# Patient Record
Sex: Male | Born: 1955 | Race: White | Hispanic: No | Marital: Married | Smoking: Former smoker
Health system: Southern US, Community
[De-identification: ages and names within clinical notes are randomized; demographics above are authoritative.]

## PROBLEM LIST (undated history)

## (undated) DIAGNOSIS — M75100 Unspecified rotator cuff tear or rupture of unspecified shoulder, not specified as traumatic: Secondary | ICD-10-CM

## (undated) DIAGNOSIS — M8580 Other specified disorders of bone density and structure, unspecified site: Secondary | ICD-10-CM

## (undated) HISTORY — DX: Unspecified rotator cuff tear or rupture of unspecified shoulder, not specified as traumatic: M75.100

## (undated) HISTORY — PX: REFRACTIVE SURGERY: SHX103

## (undated) HISTORY — PX: VARICOSE VEIN SURGERY: SHX832

## (undated) HISTORY — PX: APPENDECTOMY: SHX54

---

## 2000-04-07 ENCOUNTER — Ambulatory Visit (HOSPITAL_COMMUNITY): Admission: RE | Admit: 2000-04-07 | Discharge: 2000-04-07 | Payer: Self-pay | Admitting: Specialist

## 2000-04-07 ENCOUNTER — Encounter: Payer: Self-pay | Admitting: Specialist

## 2009-12-23 ENCOUNTER — Encounter: Admission: RE | Admit: 2009-12-23 | Discharge: 2010-01-19 | Payer: Self-pay | Admitting: Emergency Medicine

## 2009-12-23 ENCOUNTER — Ambulatory Visit: Payer: Self-pay | Admitting: Emergency Medicine

## 2009-12-23 DIAGNOSIS — M545 Low back pain: Secondary | ICD-10-CM

## 2009-12-24 ENCOUNTER — Encounter: Payer: Self-pay | Admitting: Emergency Medicine

## 2010-01-07 ENCOUNTER — Telehealth (INDEPENDENT_AMBULATORY_CARE_PROVIDER_SITE_OTHER): Payer: Self-pay | Admitting: *Deleted

## 2010-01-20 ENCOUNTER — Encounter: Payer: Self-pay | Admitting: Emergency Medicine

## 2010-04-21 NOTE — Letter (Signed)
Summary: DISCHARGE SUMMARY FOR PHYSCIAL THERAPY SERVICES  DISCHARGE SUMMARY FOR PHYSICAL   Imported By: Dannette Barbara 01/20/2010 11:57:06  _____________________________________________________________________  External Attachment:    Type:   Image     Comment:   External Document

## 2010-04-21 NOTE — Progress Notes (Signed)
  Phone Note Outgoing Call Call back at [facility] Phone [phone]   Call placed by: Emilio Math,  January 07, 2010 2:13 PM Call placed to: Patient Summary of Call: left msg, hope back is feeling better

## 2010-04-21 NOTE — Assessment & Plan Note (Signed)
Summary: BACK PAIN/NH   Vital Signs:  Patient Profile:   55 Years Old Male CC:      Back Pain re-occuring Height:     66.5 inches Weight:      184 pounds O2 Sat:      99 % O2 treatment:    Room Air Temp:     98.8 degrees F oral Pulse rate:   66 / minute Resp:     12 per minute BP sitting:   120 / 77  (left arm) Cuff size:   large  Pt. in pain?   yes    Location:   lower right back    Intensity:   6    Type:       sharp/tingling  Vitals Entered By: Lajean Saver RN (December 23, 2009 10:45 AM)                   Updated Prior Medication List: ALEVE 220 MG TABS (NAPROXEN SODIUM)   Current Allergies: No known allergies History of Present Illness History from: patient Chief Complaint: Back Pain re-occuring History of Present Illness: Reoccurring back pain for months-years.  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  Has only taken Aleve which helps a little bit.  No known trauma.  Always R sided pain w/ spasms.  Last time he was putting on his socks and felt the pain.  Sometimes it causes him to have to go home from work.  No radiation of pain.  Pain is tightness and sharp at times and intermittant.  REVIEW OF SYSTEMS Constitutional Symptoms      Denies fever, chills, night sweats, weight loss, weight gain, and fatigue.  Eyes       Denies change in vision, eye pain, eye discharge, glasses, contact lenses, and eye surgery. Ear/Nose/Throat/Mouth       Denies hearing loss/aids, change in hearing, ear pain, ear discharge, dizziness, frequent runny nose, frequent nose bleeds, sinus problems, sore throat, hoarseness, and tooth pain or bleeding.  Respiratory       Denies dry cough, productive cough, wheezing, shortness of breath, asthma, bronchitis, and emphysema/COPD.  Cardiovascular       Denies murmurs, chest pain, and tires easily with exhertion.    Gastrointestinal       Denies stomach pain, nausea/vomiting, diarrhea, constipation, blood in bowel movements, and  indigestion. Genitourniary       Denies painful urination, kidney stones, and loss of urinary control. Neurological       Complains of numbness and tingling.      Denies paralysis, seizures, and fainting/blackouts. Musculoskeletal       Complains of muscle pain and decreased range of motion.      Denies joint pain, joint stiffness, redness, swelling, muscle weakness, and gout.  Skin       Denies bruising, unusual mles/lumps or sores, and hair/skin or nail changes.  Psych       Denies mood changes, temper/anger issues, anxiety/stress, speech problems, depression, and sleep problems. Other Comments: Patient c/o re-occuring lower right back pain for several months. It occur with activity of particular movements. He c/o tingling at the site of pain. He has not seen his PCP for this. His PCP moved to [place], [place] card was given.    Past History:  Past Medical History: Osteopenia  Past Surgical History: Appendectomy vericose vein removed  Family History: Back pain- siblings  Social History: smokes 1 cigar a month Alcohol use-yes 6-12/week Drug  use-no Occupation: [employer] Drug Use:  no Physical Exam General appearance: well developed, well nourished, no acute distress Heart: regular rate and  rhythm, no murmur.  No bruits heard, no AAA felt. Extremities: normal extremities Neurological: grossly intact and non-focal Back: R lumbar paraspinal spasms palpable, TTP at that spot.  No bony midline tenderness.  SLR neg.  FROM but limited by some stiffness. Skin: no obvious rashes or lesions MSE: oriented to time, place, and person Assessment New Problems: BACK, LOWER, PAIN (ICD-724.2)   Patient Education: Patient and/or caregiver instructed in the following: exercise, weight loss.  Plan New Medications/Changes: FLEXERIL 10 MG TABS (CYCLOBENZAPRINE HCL) 1 tab by mouth at bedtime as needed for spasms  #14 x 0, 12/23/2009, Hoyt Koch MD MELOXICAM 7.5 MG TABS  (MELOXICAM) 1 tab by mouth two times a day for 10 days, then as needed  #40 x 1, 12/23/2009, Hoyt Koch MD  New Orders: New Patient Level II [phone] Planning Comments:   First, PT + Mobic + Flexeril + heat.   If not improving, then Xray and referral to ortho. Follow-up with your primary care physician if not improving or if getting worse.   The patient and/or caregiver has been counseled thoroughly with regard to medications prescribed including dosage, schedule, interactions, rationale for use, and possible side effects and they verbalize understanding.  Diagnoses and expected course of recovery discussed and will return if not improved as expected or if the condition worsens. Patient and/or caregiver verbalized understanding.  Prescriptions: FLEXERIL 10 MG TABS (CYCLOBENZAPRINE HCL) 1 tab by mouth at bedtime as needed for spasms  #14 x 0   Entered and Authorized by:   Hoyt Koch MD   Signed by:   Hoyt Koch MD on 12/23/2009   Method used:   Print then Give to Patient   RxID:   [id number] MELOXICAM 7.5 MG TABS (MELOXICAM) 1 tab by mouth two times a day for 10 days, then as needed  #40 x 1   Entered and Authorized by:   Hoyt Koch MD   Signed by:   Hoyt Koch MD on 12/23/2009   Method used:   Print then Give to Patient   RxID:   [id number]   Orders Added: 1)  New Patient Level II [08676]

## 2010-04-21 NOTE — Letter (Signed)
Summary: EVALUATION FOR PT  EVALUATION FOR PT   Imported By: Dannette Barbara 12/24/2009 13:44:34  _____________________________________________________________________  External Attachment:    Type:   Image     Comment:   External Document

## 2010-11-25 ENCOUNTER — Inpatient Hospital Stay (INDEPENDENT_AMBULATORY_CARE_PROVIDER_SITE_OTHER)
Admission: RE | Admit: 2010-11-25 | Discharge: 2010-11-25 | Disposition: A | Discharge status: Home / Self Care | Payer: 59 | Source: Ambulatory Visit | Attending: Emergency Medicine | Admitting: Emergency Medicine

## 2010-11-25 ENCOUNTER — Encounter: Payer: Self-pay | Admitting: Emergency Medicine

## 2010-11-25 DIAGNOSIS — M545 Low back pain: Secondary | ICD-10-CM

## 2011-02-22 NOTE — Progress Notes (Signed)
Summary: pulled muscle Room 5   Vital Signs:  Patient Profile:   55 Years Old Male CC:      Lower right back pain radiates down to right knee painful with movement Height:     66.5 inches Weight:      178 pounds O2 Sat:      98 % O2 treatment:    Room Air Temp:     98.1 degrees F oral Pulse rate:   54 / minute Pulse rhythm:   regular Resp:     16 per minute BP sitting:   130 / 80  (left arm) Cuff size:   regular  Pt. in pain?   yes    Location:   lower back    Intensity:   9    Type:       sharp  Vitals Entered By: Emilio Math (November 25, 2010 9:09 AM)                   Current Allergies: No known allergies History of Present Illness Chief Complaint: Lower right back pain radiates down to right knee painful with movement History of Present Illness: Lower back pain / buttock pain that radiates to R knee.  Hurts with certain movements.  No trauma, no falls, no heavy lifting.   Trauma: no Bladder/bowel incontinence: no Weakness: no Fever/chills: no Night pain: no Unexplained weight loss: no Cancer/immunosuppression: no PMH of chronic steroid use:  no  Current Meds ALEVE 220 MG TABS (NAPROXEN SODIUM)  FLEXERIL 10 MG TABS (CYCLOBENZAPRINE HCL) 1 by mouth up to three times a day as needed for spasms TRAMADOL HCL 50 MG TABS (TRAMADOL HCL) 1 by mouth q6 hrs as needed for pain PREDNISONE 20 MG TABS (PREDNISONE) 1 by mouth two times a day for 3 days  REVIEW OF SYSTEMS Constitutional Symptoms      Denies fever, chills, night sweats, weight loss, weight gain, and fatigue.  Eyes       Denies change in vision, eye pain, eye discharge, glasses, contact lenses, and eye surgery. Ear/Nose/Throat/Mouth       Denies hearing loss/aids, change in hearing, ear pain, ear discharge, dizziness, frequent runny nose, frequent nose bleeds, sinus problems, sore throat, hoarseness, and tooth pain or bleeding.  Respiratory       Denies dry cough, productive cough, wheezing, shortness  of breath, asthma, bronchitis, and emphysema/COPD.  Cardiovascular       Denies murmurs, chest pain, and tires easily with exhertion.    Gastrointestinal       Denies stomach pain, nausea/vomiting, diarrhea, constipation, blood in bowel movements, and indigestion. Genitourniary       Denies painful urination, kidney stones, and loss of urinary control. Neurological       Denies paralysis, seizures, and fainting/blackouts. Musculoskeletal       Complains of muscle pain, joint pain, joint stiffness, and decreased range of motion.      Denies redness, swelling, muscle weakness, and gout.  Skin       Denies bruising, unusual mles/lumps or sores, and hair/skin or nail changes.  Psych       Denies mood changes, temper/anger issues, anxiety/stress, speech problems, depression, and sleep problems.  Past History:  Past Medical History: Reviewed history from 12/23/2009 and no changes required. Osteopenia  Past Surgical History: Reviewed history from 12/23/2009 and no changes required. Appendectomy vericose vein removed  Family History: Reviewed history from 12/23/2009 and no changes required. Back pain- siblings  Social History:  Reviewed history from 12/23/2009 and no changes required. smokes 1 cigar a month Alcohol use-yes 6-12/week Drug use-no Occupation: [employer] Physical Exam General appearance: well developed, well nourished, no acute distress MSE: oriented to time, place, and person R hip/back: FROM log roll, Flex/ext, abd.  SLR + causes pain in buttock.  No TTP back, SI.  Mild TTP sciatic notch / piriformis.   Resisted flex/ext of hip and knee don't cause any pain.  Distal NV status intact.  Walks with a limp.  Plan New Medications/Changes: PREDNISONE 20 MG TABS (PREDNISONE) 1 by mouth two times a day for 3 days  #6 x 0, 11/25/2010, Hoyt Koch MD TRAMADOL HCL 50 MG TABS (TRAMADOL HCL) 1 by mouth q6 hrs as needed for pain  #24 x 0, 11/25/2010, Hoyt Koch  MD FLEXERIL 10 MG TABS (CYCLOBENZAPRINE HCL) 1 by mouth up to three times a day as needed for spasms  #18 x 0, 11/25/2010, Hoyt Koch MD  New Orders: Est. Patient Level III [phone] Planning Comments:   Likely is sciatic irritation from piriformis (etc).  Will give prednisone, tramadol, and flexeril.  Heating pad, rest, gentle ROM.  No red flags for spinal problems, but would Xray if not improving or if new symptoms.  DDx includes insertional hamstring tendonitis, piriformis syndrome, slipped disk.  If not improving in a few days, will send to ortho/sports med +/- PT.   The patient and/or caregiver has been counseled thoroughly with regard to medications prescribed including dosage, schedule, interactions, rationale for use, and possible side effects and they verbalize understanding.  Diagnoses and expected course of recovery discussed and will return if not improved as expected or if the condition worsens. Patient and/or caregiver verbalized understanding.  Prescriptions: PREDNISONE 20 MG TABS (PREDNISONE) 1 by mouth two times a day for 3 days  #6 x 0   Entered and Authorized by:   Hoyt Koch MD   Signed by:   Hoyt Koch MD on 11/25/2010   Method used:   Print then Give to Patient   RxID:   [id number] TRAMADOL HCL 50 MG TABS (TRAMADOL HCL) 1 by mouth q6 hrs as needed for pain  #24 x 0   Entered and Authorized by:   Hoyt Koch MD   Signed by:   Hoyt Koch MD on 11/25/2010   Method used:   Print then Give to Patient   RxID:   [id number] FLEXERIL 10 MG TABS (CYCLOBENZAPRINE HCL) 1 by mouth up to three times a day as needed for spasms  #18 x 0   Entered and Authorized by:   Hoyt Koch MD   Signed by:   Hoyt Koch MD on 11/25/2010   Method used:   Print then Give to Patient   RxID:   [id number]   Orders Added: 1)  Est. Patient Level III [25366]

## 2011-04-11 ENCOUNTER — Emergency Department
Admission: EM | Admit: 2011-04-11 | Discharge: 2011-04-11 | Disposition: A | Discharge status: Home / Self Care | Payer: 59 | Source: Home / Self Care | Attending: Emergency Medicine | Admitting: Emergency Medicine

## 2011-04-11 ENCOUNTER — Encounter: Payer: Self-pay | Admitting: Emergency Medicine

## 2011-04-11 DIAGNOSIS — S0500XA Injury of conjunctiva and corneal abrasion without foreign body, unspecified eye, initial encounter: Secondary | ICD-10-CM

## 2011-04-11 DIAGNOSIS — S058X9A Other injuries of unspecified eye and orbit, initial encounter: Secondary | ICD-10-CM

## 2011-04-11 HISTORY — DX: Other specified disorders of bone density and structure, unspecified site: M85.80

## 2011-04-11 MED ORDER — EPINEPHRINE 0.3 MG/0.3ML IJ DEVI: 0.3000 mg | INTRAMUSCULAR | Status: DC | PRN

## 2011-04-11 MED ORDER — POLYMYXIN B-TRIMETHOPRIM 10000-0.1 UNIT/ML-% OP SOLN: 1.0000 [drp] | Freq: Four times a day (QID) | OPHTHALMIC | Status: AC

## 2011-04-11 NOTE — ED Provider Notes (Signed)
History     CSN: [id number]  Arrival date & time 04/11/11  1134   First MD Initiated Contact with Patient 04/11/11 1209      Chief Complaint  Patient presents with  . Eye Drainage    (Consider location/radiation/quality/duration/timing/severity/associated sxs/prior treatment) HPI  Nicolas Boone presents today with an EYE COMPLAINT.  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 Since then it has been watering and he is concerned that there is something still there.  Location: right  Onset: 2  Days   Symptoms: Redness: yes Discharge: yes Pain: no Photophobia: no Decreased Vision: no URI symptoms: no Itching/Allergy sxs: no Glaucoma: no Recent eye surgery: no Contact lens use: no  Red Flags Trauma: no Foreign Body: no Vomiting/HA: no Halos around lights: no Chickenpox or zoster: no     Past Medical History  Diagnosis Date  . Osteopenia     Past Surgical History  Procedure Date  . Appendectomy   . Lasix     History reviewed. No pertinent family history.  History  Substance Use Topics  . Smoking status: Current Some Day Smoker  . Smokeless tobacco: Not on file  . Alcohol Use: Yes      Review of Systems  Allergies  Bee  Home Medications   Current Outpatient Rx  Name Route Sig Dispense Refill  . EPINEPHRINE 0.3 MG/0.3ML IJ DEVI Intramuscular Inject 0.3 mLs (0.3 mg total) into the muscle as needed. 1 Device 2  . POLYMYXIN B-TRIMETHOPRIM 10000-0.1 UNIT/ML-% OP SOLN Ophthalmic Apply 1 drop to eye every 6 (six) hours. 10 mL 0    BP 131/81  Pulse 69  Temp(Src) 98.3 F (36.8 C) (Oral)  Resp 18  Ht 5' 6.5" (1.689 m)  Wt 175 lb (79.379 kg)  BMI 27.82 kg/m2  SpO2 99%  Physical Exam  Nursing note and vitals reviewed. Constitutional: He is oriented to person, place, and time. He appears well-developed and well-nourished.  HENT:  Head: Normocephalic and  atraumatic.  Eyes: No scleral icterus.       PERRLA EOMI.  funduscopic examination is normal. Examination of his right eye demonstrates redness on the area between 3:00 and 6:00 with some general conjunctival injection around the whole eye. I inverted the lids and swept them and I do not see any foreign bodies. Fluorescein examination demonstrates some take in the same area of erythema from 3:00 to 6:00. There are no dendrites or ulcers that are seen.  Neck: Neck supple.  Cardiovascular: Regular rhythm and normal heart sounds.   Pulmonary/Chest: Effort normal and breath sounds normal. No respiratory distress.  Neurological: He is alert and oriented to person, place, and time.  Skin: Skin is warm and dry.  Psychiatric: He has a normal mood and affect. His speech is normal.    ED Course  Procedures (including critical care time)  Labs Reviewed - No data to display No results found.   1. Corneal abrasion       MDM   I do not see any foreign bodies. The most likely diagnosis is mild irritation or a healing corneal abrasion. I gave him a prescription for Polytrim eyedrops to use for the next few days for lubrication and to prevent any kind of infection. This should be improving in the next day or 2. If it is not then we will be giving him a call and we  will need to get him to see an eye doctor. This injury did happen at work but he is unsure if he is following with Worker's Comp. He has talked to his job first    Lily Kocher, MD 04/11/11 [phone]

## 2011-04-11 NOTE — ED Notes (Signed)
Right eye drainage x 2 days; wonders if foreign body in it from work?

## 2011-04-13 ENCOUNTER — Telehealth: Payer: Self-pay | Admitting: *Deleted

## 2013-03-08 LAB — HM COLONOSCOPY

## 2013-08-18 ENCOUNTER — Encounter: Payer: Self-pay | Admitting: Emergency Medicine

## 2013-08-18 ENCOUNTER — Emergency Department
Admission: EM | Admit: 2013-08-18 | Discharge: 2013-08-18 | Disposition: A | Discharge status: Home / Self Care | Payer: 59 | Source: Home / Self Care | Attending: Emergency Medicine | Admitting: Emergency Medicine

## 2013-08-18 DIAGNOSIS — H6123 Impacted cerumen, bilateral: Secondary | ICD-10-CM

## 2013-08-18 DIAGNOSIS — H6120 Impacted cerumen, unspecified ear: Secondary | ICD-10-CM

## 2013-08-18 NOTE — ED Provider Notes (Signed)
CSN: [id number]     Arrival date & time 08/18/13  0906 History   None    Chief Complaint  Patient presents with  . Ear Fullness   (Consider location/radiation/quality/duration/timing/severity/associated sxs/prior Treatment) HPI Nicolas Boone is a 58 y.o. male who complains of onset of ear pain for a few days.  The symptoms are constant and mild-moderate in severity. No sore throat No cough No pleuritic pain No wheezing No nasal congestion No post-nasal drainage No sinus pain/pressure No chest congestion No itchy/red eyes + earache (feel plugged up) L>R.  has tried some peroxide but has not helped. No hemoptysis No SOB No chills/sweats No fever No nausea No vomiting No abdominal pain No diarrhea No skin rashes No fatigue No myalgias No headache    Past Medical History  Diagnosis Date  . Osteopenia    Past Surgical History  Procedure Laterality Date  . Appendectomy    . Varicose vein surgery    . Refractive surgery     History reviewed. No pertinent family history. History  Substance Use Topics  . Smoking status: Former Smoker    Types: Cigars  . Smokeless tobacco: Not on file  . Alcohol Use: Yes    Review of Systems  All other systems reviewed and are negative.   Allergies  Bee venom and Nutritional supplements  Home Medications   Prior to Admission medications   Medication Sig Start Date End Date Taking? Authorizing Provider  EPINEPHrine (EPIPEN) 0.3 mg/0.3 mL DEVI Inject 0.3 mLs (0.3 mg total) into the muscle as needed. 04/11/11  Yes Marlaine Hind, MD   BP 125/72  Pulse 59  Temp(Src) 98 F (36.7 C) (Oral)  Ht 5' 6.5" (1.689 m)  Wt 187 lb (84.823 kg)  BMI 29.73 kg/m2  SpO2 97% Physical Exam  Nursing note and vitals reviewed. Constitutional: He is oriented to person, place, and time. He appears well-developed and well-nourished.  HENT:  Head: Normocephalic and atraumatic.  Nose: Nose normal.  Mouth/Throat: No oropharyngeal exudate,  posterior oropharyngeal edema or posterior oropharyngeal erythema.  Bilateral cerumen impaction.  Status post lavage, cerumen has been removed.  No signs of infection.  Eyes: No scleral icterus.  Neck: Neck supple.  Cardiovascular: Regular rhythm and normal heart sounds.   Pulmonary/Chest: Effort normal and breath sounds normal. No respiratory distress.  Neurological: He is alert and oriented to person, place, and time.  Skin: Skin is warm and dry.  Psychiatric: He has a normal mood and affect. His speech is normal.    ED Course  Procedures (including critical care time) Labs Review Labs Reviewed - No data to display  Imaging Review No results found.   MDM   1. Bilateral impacted cerumen     Ear cerumen impaction bilateral.  We flushed out years.  Pain subsided afterwards.  Followup when necessary.  Marlaine Hind, MD 08/18/13 1341

## 2013-08-18 NOTE — ED Notes (Signed)
Nicolas Boone complains of ear pain and fullness for 2 days. Denies fever, chills or sweats.

## 2013-11-13 ENCOUNTER — Encounter: Payer: Self-pay | Admitting: Family Medicine

## 2013-11-13 ENCOUNTER — Ambulatory Visit (INDEPENDENT_AMBULATORY_CARE_PROVIDER_SITE_OTHER): Payer: 59 | Admitting: Family Medicine

## 2013-11-13 ENCOUNTER — Telehealth: Payer: Self-pay | Admitting: Family Medicine

## 2013-11-13 VITALS — BP 109/65 | HR 72 | Ht 66.6 in | Wt 189.0 lb

## 2013-11-13 DIAGNOSIS — R9431 Abnormal electrocardiogram [ECG] [EKG]: Secondary | ICD-10-CM

## 2013-11-13 DIAGNOSIS — M8580 Other specified disorders of bone density and structure, unspecified site: Secondary | ICD-10-CM

## 2013-11-13 DIAGNOSIS — M949 Disorder of cartilage, unspecified: Secondary | ICD-10-CM

## 2013-11-13 DIAGNOSIS — M899 Disorder of bone, unspecified: Secondary | ICD-10-CM

## 2013-11-13 DIAGNOSIS — Z0000 Encounter for general adult medical examination without abnormal findings: Secondary | ICD-10-CM

## 2013-11-13 DIAGNOSIS — Z91030 Bee allergy status: Secondary | ICD-10-CM | POA: Insufficient documentation

## 2013-11-13 DIAGNOSIS — I499 Cardiac arrhythmia, unspecified: Secondary | ICD-10-CM

## 2013-11-13 DIAGNOSIS — R0789 Other chest pain: Secondary | ICD-10-CM

## 2013-11-13 DIAGNOSIS — K449 Diaphragmatic hernia without obstruction or gangrene: Secondary | ICD-10-CM

## 2013-11-13 MED ORDER — EPINEPHRINE 0.3 MG/0.3ML IJ SOAJ: 0.3000 mg | Freq: Once | INTRAMUSCULAR | Status: DC

## 2013-11-13 NOTE — Assessment & Plan Note (Signed)
Filled Rx for Epipen 2 pack

## 2013-11-13 NOTE — Progress Notes (Signed)
Subjective:    Patient ID: Nicolas Boone, male    DOB: 1955-07-15, 58 y.o.   MRN: [id number]  HPI Here for establish care and CPE.    [facility]  Done 01/2013.   Last eye exam was about 3 months ago.  Goes to [facility] in [place].    Reports that his tdap is up-to-date. He says he gets that through work.  Declines a flu shot today.  He says he has had shingles so does not want the shingles vaccine.  He says he has had some chest pain the past which he related to his hiatal hernia. No prior history of heart disease and no family history of heart disease.  Review of Systems  Constitutional: Negative for fever, diaphoresis and unexpected weight change.  HENT: Negative for hearing loss, rhinorrhea, sneezing and tinnitus.   Eyes: Negative for visual disturbance.  Respiratory: Negative for cough and wheezing.   Cardiovascular: Negative for chest pain and palpitations.  Gastrointestinal: Negative for nausea, vomiting, diarrhea and blood in stool.  Genitourinary: Negative for dysuria and discharge.  Musculoskeletal: Negative for arthralgias and myalgias.  Skin: Negative for rash.  Neurological: Negative for headaches.  Hematological: Negative for adenopathy.  Psychiatric/Behavioral: Negative for sleep disturbance and dysphoric mood. The patient is not nervous/anxious.     BP 109/65  Pulse 72  Ht 5' 6.6" (1.692 m)  Wt 189 lb (85.73 kg)  BMI 29.95 kg/m2    Allergies  Allergen Reactions  . Bee Venom   . Nutritional Supplements     Needs epi-pen rx    Past Medical History  Diagnosis Date  . Osteopenia     Past Surgical History  Procedure Laterality Date  . Appendectomy    . Varicose vein surgery    . Refractive surgery      History   Social History  . Marital Status: Single    Spouse Name: Montey Hora    Number of Children: 1  . Years of Education: N/A   Occupational History  . Associate Professor [employer]   Social History  Main Topics  . Smoking status: Former Smoker    Types: Cigars  . Smokeless tobacco: Not on file  . Alcohol Use: Yes  . Drug Use: No  . Sexual Activity: Yes    Partners: Female   Other Topics Concern  . 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  He is married to [place] with 1 child.     Family History  Problem Relation Age of Onset  . Throat cancer Father   . Kidney cancer Brother   . Ovarian cancer Sister   . Diabetes Mellitus II Brother   . Diabetes Mellitus II Sister     Outpatient Encounter Prescriptions as of 11/13/2013  Medication Sig  . clotrimazole (LOTRIMIN AF) 1 % cream Apply 1 application topically 2 (two) times daily.  [name] EPINEPHrine (EPIPEN) 0.3 mg/0.3 mL DEVI Inject 0.3 mLs (0.3 mg total) into the muscle as needed.          Objective:   Physical Exam  Constitutional: He is oriented to person, place, and time. He appears well-developed and well-nourished.  HENT:  Head: Normocephalic and atraumatic.  Right Ear: External ear normal.  Left Ear: External ear normal.  Nose: Nose normal.  Mouth/Throat: Oropharynx is clear and moist.  Eyes: Conjunctivae and EOM are normal. Pupils are equal, round,  and reactive to light.  Neck: Normal range of motion. Neck supple. No thyromegaly present.  Cardiovascular: Normal rate, regular rhythm, normal heart sounds and intact distal pulses.   Pulmonary/Chest: Effort normal and breath sounds normal.  Abdominal: Soft. Bowel sounds are normal. He exhibits no distension and no mass. There is no tenderness. There is no rebound and no guarding.  Musculoskeletal: Normal range of motion.  Lymphadenopathy:    He has no cervical adenopathy.  Neurological: He is alert and oriented to person, place, and time. He has normal reflexes.  Skin: Skin is warm and dry.  Psychiatric: He has a normal mood and affect. His behavior is normal. Judgment and thought content normal.           Assessment & Plan:  Declined flu vaccine today.  CPE Keep up a regular exercise program and make sure you are eating a healthy diet Try to eat 4 servings of dairy a day, or if you are lactose intolerant take a calcium with vitamin D daily.  Your vaccines are up to date.   Irregular heart rhythm-today on exam he was in rhythm but we will do an EKG today. EKG shows rate of 78 beats per minute, sinus rhythm with PVCs. Left axis deviation. Poor R wave progression in the lateral leads. First degree AV block.  Due for screening labs including CMP, lipid panel and PSA. Next graft he was told he had osteopenia about 10 years ago he was encouraged to exercise. We will get a repeat DEXA scan scheduled for him.

## 2013-11-13 NOTE — Telephone Encounter (Signed)
Call patient: He does have a few abnormalities on EKG. Nothing that appears to be acute but I would like to get him in with her cardiologist to review and just make sure that there is no sign of heart disease.

## 2013-11-14 ENCOUNTER — Telehealth: Payer: Self-pay | Admitting: *Deleted

## 2013-11-14 NOTE — Telephone Encounter (Signed)
Spoke with pt's wife about appt. He has appt on 10.12.15 .Loralee Pacas [place]

## 2013-11-14 NOTE — Telephone Encounter (Signed)
Error

## 2013-11-15 NOTE — Addendum Note (Signed)
Addended by: Chalmers Cater on: 11/15/2013 08:00 AM   Modules accepted: Orders

## 2013-11-20 ENCOUNTER — Ambulatory Visit (INDEPENDENT_AMBULATORY_CARE_PROVIDER_SITE_OTHER): Payer: 59

## 2013-11-20 DIAGNOSIS — M810 Age-related osteoporosis without current pathological fracture: Secondary | ICD-10-CM

## 2013-11-20 DIAGNOSIS — M8580 Other specified disorders of bone density and structure, unspecified site: Secondary | ICD-10-CM

## 2013-11-21 ENCOUNTER — Encounter: Payer: Self-pay | Admitting: Family Medicine

## 2013-11-21 DIAGNOSIS — M810 Age-related osteoporosis without current pathological fracture: Secondary | ICD-10-CM | POA: Insufficient documentation

## 2013-11-21 LAB — COMPLETE METABOLIC PANEL WITH GFR
ALT: 16 U/L (ref 0–53)
AST: 19 U/L (ref 0–37)
Albumin: 4.4 g/dL (ref 3.5–5.2)
Alkaline Phosphatase: 63 U/L (ref 39–117)
BUN: 15 mg/dL (ref 6–23)
CO2: 24 mEq/L (ref 19–32)
Calcium: 9.4 mg/dL (ref 8.4–10.5)
Chloride: 104 mEq/L (ref 96–112)
Creat: 0.84 mg/dL (ref 0.50–1.35)
GFR, Est African American: >89 mL/min
GFR, Est Non African American: >89 mL/min
Glucose, Bld: 100 mg/dL — ABNORMAL HIGH (ref 70–99)
Potassium: 4.7 mEq/L (ref 3.5–5.3)
Sodium: 140 mEq/L (ref 135–145)
Total Bilirubin: 0.7 mg/dL (ref 0.2–1.2)
Total Protein: 6.5 g/dL (ref 6.0–8.3)

## 2013-11-21 LAB — PSA: PSA: 2.07 ng/mL (ref <=4.00)

## 2013-11-21 LAB — HEMOGLOBIN A1C
Hgb A1c MFr Bld: 5.9 % — ABNORMAL HIGH (ref <5.7)
Mean Plasma Glucose: 123 mg/dL — ABNORMAL HIGH (ref <117)

## 2013-11-21 LAB — CBC
HCT: 44.8 % (ref 39.0–52.0)
Hemoglobin: 15.5 g/dL (ref 13.0–17.0)
MCH: 30.6 pg (ref 26.0–34.0)
MCHC: 34.6 g/dL (ref 30.0–36.0)
MCV: 88.4 fL (ref 78.0–100.0)
Platelets: 226 10*3/uL (ref 150–400)
RBC: 5.07 MIL/uL (ref 4.22–5.81)
RDW: 14.7 % (ref 11.5–15.5)
WBC: 5.1 10*3/uL (ref 4.0–10.5)

## 2013-11-21 LAB — LIPID PANEL
Cholesterol: 195 mg/dL (ref 0–200)
HDL: 57 mg/dL (ref >39)
LDL Cholesterol: 124 mg/dL — ABNORMAL HIGH (ref 0–99)
Total CHOL/HDL Ratio: 3.4 Ratio
Triglycerides: 69 mg/dL (ref <150)
VLDL: 14 mg/dL (ref 0–40)

## 2013-11-21 LAB — TSH: TSH: 0.975 u[IU]/mL (ref 0.350–4.500)

## 2013-11-28 ENCOUNTER — Other Ambulatory Visit: Payer: Self-pay | Admitting: Family Medicine

## 2013-11-28 MED ORDER — ALENDRONATE SODIUM 70 MG PO TABS: 70.0000 mg | ORAL_TABLET | ORAL | Status: DC

## 2013-11-30 ENCOUNTER — Telehealth: Payer: Self-pay | Admitting: *Deleted

## 2013-11-30 NOTE — Telephone Encounter (Signed)
Called and gave pt's wife the following information:  Call patient: Complete blood count is normal. Thyroid looks great. Metabolic panel including kidney and liver function are normal. Hemoglobin A1c shows that he is in the prediabetic range. Make sure avoiding concentrated sweets, watching his portion sizes on carbohydrates and getting regular exercise. We will recheck his A1c in 6 months. LDL cholesterol is mildly elevated. Again regular exercise and low-fat diet will help improve this.

## 2014-01-09 ENCOUNTER — Institutional Professional Consult (permissible substitution): Payer: 59 | Admitting: Cardiology

## 2014-04-24 ENCOUNTER — Other Ambulatory Visit: Payer: Self-pay | Admitting: *Deleted

## 2014-04-24 ENCOUNTER — Telehealth: Payer: Self-pay | Admitting: Emergency Medicine

## 2014-04-24 DIAGNOSIS — R7309 Other abnormal glucose: Secondary | ICD-10-CM

## 2014-04-24 NOTE — Telephone Encounter (Signed)
Wife of patient wants to know if patient needs to have lipid profile drawn at same time HgbA1c is drawn; informed her that insurance may have limits on spacing this to 6 months and last one drawn 11-20-13.

## 2014-04-25 ENCOUNTER — Encounter: Payer: Self-pay | Admitting: Family Medicine

## 2014-04-25 DIAGNOSIS — R7301 Impaired fasting glucose: Secondary | ICD-10-CM | POA: Insufficient documentation

## 2014-04-25 LAB — HEMOGLOBIN A1C
Hgb A1c MFr Bld: 5.7 % — ABNORMAL HIGH (ref <5.7)
MEAN PLASMA GLUCOSE: 117 mg/dL — AB (ref <117)

## 2014-04-29 ENCOUNTER — Ambulatory Visit (INDEPENDENT_AMBULATORY_CARE_PROVIDER_SITE_OTHER): Payer: 59 | Admitting: Family Medicine

## 2014-04-29 ENCOUNTER — Encounter: Payer: Self-pay | Admitting: Family Medicine

## 2014-04-29 VITALS — BP 121/76 | HR 79 | Ht 65.5 in | Wt 177.0 lb

## 2014-04-29 DIAGNOSIS — H6123 Impacted cerumen, bilateral: Secondary | ICD-10-CM

## 2014-04-29 DIAGNOSIS — M810 Age-related osteoporosis without current pathological fracture: Secondary | ICD-10-CM

## 2014-04-29 DIAGNOSIS — R7301 Impaired fasting glucose: Secondary | ICD-10-CM

## 2014-04-29 NOTE — Progress Notes (Signed)
   Subjective:    Patient ID: Nicolas Boone, male    DOB: 14-Jan-1956, 59 y.o.   MRN: [id number]  HPI Six-month follow-up for impaired fasting glucose-repeat A1c was 5.7 actually come down a little bit. Has lost aobut 12 lbs and is doing great. Leg cramps are better since started eatig more veggies.   Bilateral cerumen impaction-has had to have his ears irrigated previously would like to have this done again today.   Osteoporosis-he's not taking the bisphosphonate anymore he took it for couple months and then decided to stop it. He was not having any major side effects but just decided he was complete changing his lifestyle and is eating so much better and has been exercising regularly. He also stopped the calcium supplement as he is really trying to get the appropriate amount of calcium from his regular diet. Review of Systems     Objective:   Physical Exam  Constitutional: He is oriented to person, place, and time. He appears well-developed and well-nourished.  HENT:  Head: Normocephalic and atraumatic.  bilat cerumen impaction  Cardiovascular: Normal rate, regular rhythm and normal heart sounds.   Pulmonary/Chest: Effort normal and breath sounds normal.  Neurological: He is alert and oriented to person, place, and time.  Skin: Skin is warm and dry.  Psychiatric: He has a normal mood and affect. His behavior is normal.          Assessment & Plan:  IFG - Well controlled. A1C is down today.  He looks fantastic. Has lost 12 pounds. Congratulated him on his success. Follow-up in 6 months.  Osteoporosis-his T score was -2.6. We discussed that he could certainly consider restarting the bisphosphonate in the calcium but ultimately that is up to him based on current guidelines I would recommend he take a bisphosphonate. He really wants to work on diet and exercise first.  Bilateral cerumen impaction-irrigation performed today. Patient tolerated well.

## 2014-11-12 ENCOUNTER — Telehealth: Payer: Self-pay | Admitting: *Deleted

## 2014-11-12 DIAGNOSIS — Z0000 Encounter for general adult medical examination without abnormal findings: Secondary | ICD-10-CM

## 2014-11-12 DIAGNOSIS — R7301 Impaired fasting glucose: Secondary | ICD-10-CM

## 2014-11-12 DIAGNOSIS — Z114 Encounter for screening for human immunodeficiency virus [HIV]: Secondary | ICD-10-CM

## 2014-11-12 DIAGNOSIS — Z1159 Encounter for screening for other viral diseases: Secondary | ICD-10-CM

## 2014-11-12 NOTE — Telephone Encounter (Signed)
Informed pt's wife of orders.Nicolas Boone [place]

## 2014-11-13 ENCOUNTER — Other Ambulatory Visit: Payer: Self-pay

## 2014-11-13 DIAGNOSIS — Z0000 Encounter for general adult medical examination without abnormal findings: Secondary | ICD-10-CM

## 2014-11-13 DIAGNOSIS — Z1159 Encounter for screening for other viral diseases: Secondary | ICD-10-CM

## 2014-11-13 DIAGNOSIS — R7301 Impaired fasting glucose: Secondary | ICD-10-CM

## 2014-11-13 DIAGNOSIS — Z114 Encounter for screening for human immunodeficiency virus [HIV]: Secondary | ICD-10-CM

## 2014-11-14 LAB — COMPLETE METABOLIC PANEL WITH GFR
ALT: 12 U/L (ref 9–46)
AST: 16 U/L (ref 10–35)
Albumin: 4 g/dL (ref 3.6–5.1)
Alkaline Phosphatase: 60 U/L (ref 40–115)
BILIRUBIN TOTAL: 0.5 mg/dL (ref 0.2–1.2)
BUN: 17 mg/dL (ref 7–25)
CO2: 26 mmol/L (ref 20–31)
CREATININE: 0.82 mg/dL (ref 0.70–1.33)
Calcium: 9.2 mg/dL (ref 8.6–10.3)
Chloride: 100 mmol/L (ref 98–110)
GFR, Est African American: >89 mL/min (ref >=60)
GLUCOSE: 95 mg/dL (ref 65–99)
Potassium: 5 mmol/L (ref 3.5–5.3)
SODIUM: 139 mmol/L (ref 135–146)
TOTAL PROTEIN: 6.1 g/dL (ref 6.1–8.1)

## 2014-11-14 LAB — LIPID PANEL
Cholesterol: 171 mg/dL (ref 125–200)
HDL: 56 mg/dL (ref >=40)
LDL CALC: 105 mg/dL (ref <130)
Total CHOL/HDL Ratio: 3.1 Ratio (ref <=5.0)
Triglycerides: 49 mg/dL (ref <150)
VLDL: 10 mg/dL (ref <30)

## 2014-11-14 LAB — HEMOGLOBIN A1C
HEMOGLOBIN A1C: 5.7 % — AB (ref <5.7)
Mean Plasma Glucose: 117 mg/dL — ABNORMAL HIGH (ref <117)

## 2014-11-14 LAB — HEPATITIS C ANTIBODY: HCV Ab: NEGATIVE

## 2014-11-14 LAB — PSA: PSA: 2.32 ng/mL (ref <=4.00)

## 2014-11-14 LAB — HIV ANTIBODY (ROUTINE TESTING W REFLEX): HIV 1&2 Ab, 4th Generation: NONREACTIVE

## 2014-11-15 ENCOUNTER — Encounter: Payer: Self-pay | Admitting: Family Medicine

## 2014-11-15 ENCOUNTER — Ambulatory Visit (INDEPENDENT_AMBULATORY_CARE_PROVIDER_SITE_OTHER): Payer: 59 | Admitting: Family Medicine

## 2014-11-15 ENCOUNTER — Encounter: Payer: 59 | Admitting: Family Medicine

## 2014-11-15 DIAGNOSIS — I8393 Asymptomatic varicose veins of bilateral lower extremities: Secondary | ICD-10-CM

## 2014-11-15 DIAGNOSIS — I8390 Asymptomatic varicose veins of unspecified lower extremity: Secondary | ICD-10-CM | POA: Insufficient documentation

## 2014-11-15 NOTE — Progress Notes (Signed)
   Subjective:    Patient ID: Nicolas Boone, male    DOB: August 25, 1955, 59 y.o.   MRN: [id number]  HPI Patient requests referral to vein and vascular specialist. He has some varicose veins on the right leg treated a couple years ago. He now is having bulging varicose veins on the left leg. He complains that they burn and itch and swell especially with exercise. He has not been wearing compression stockings. Since he had successful treatment in the past with his opposite leg keep would like a new referral.   Review of Systems     Objective:   Physical Exam  Constitutional: He appears well-developed and well-nourished.  Skin:  Varicose veins bulging on his left lower extremity.            Assessment & Plan:  Varicose veins - referral will be placed. Patient will call us back with the preference of location. I think he would be a good candidate for treatment. But did encourage him to wear compression stocking.

## 2015-01-03 ENCOUNTER — Telehealth: Payer: Self-pay | Admitting: *Deleted

## 2015-01-03 DIAGNOSIS — I8390 Asymptomatic varicose veins of unspecified lower extremity: Secondary | ICD-10-CM

## 2015-01-03 NOTE — Telephone Encounter (Signed)
Referral placed.

## 2015-02-15 ENCOUNTER — Encounter: Payer: Self-pay | Admitting: Emergency Medicine

## 2015-02-15 ENCOUNTER — Emergency Department
Admission: EM | Admit: 2015-02-15 | Discharge: 2015-02-15 | Disposition: A | Discharge status: Home / Self Care | Payer: Commercial Managed Care - HMO | Source: Home / Self Care | Attending: Family Medicine | Admitting: Family Medicine

## 2015-02-15 DIAGNOSIS — H6123 Impacted cerumen, bilateral: Secondary | ICD-10-CM

## 2015-02-15 NOTE — ED Provider Notes (Signed)
CSN: [id number]     Arrival date & time 02/15/15  1309 History   First MD Initiated Contact with Patient 02/15/15 1412     Chief Complaint  Patient presents with  . Ear Fullness      HPI Comments: Patient has a history of recurring bilateral cerumen impaction.  He states that his ears have felt clogged for about 3 to 4 months.  Patient is a 59 y.o. male presenting with plugged ear sensation. The history is provided by the patient.  Ear Fullness This is a recurrent problem. Episode onset: 3 to 4 months ago. The problem occurs constantly. The problem has not changed since onset.Associated symptoms comments: none. Nothing aggravates the symptoms. Nothing relieves the symptoms. He has tried nothing for the symptoms.    Past Medical History  Diagnosis Date  . Osteopenia    Past Surgical History  Procedure Laterality Date  . Appendectomy    . Varicose vein surgery    . Refractive surgery     Family History  Problem Relation Age of Onset  . Throat cancer Father   . Kidney cancer Brother   . Ovarian cancer Sister   . Diabetes Mellitus II Brother   . Diabetes Mellitus II Sister    Social History  Substance Use Topics  . Smoking status: Former Smoker    Types: Cigars  . Smokeless tobacco: None  . Alcohol Use: Yes    Review of Systems  All other systems reviewed and are negative.   Allergies  Bee venom and Nutritional supplements  Home Medications   Prior to Admission medications   Medication Sig Start Date End Date Taking? Authorizing Provider  clotrimazole (LOTRIMIN AF) 1 % cream Apply 1 application topically 2 (two) times daily.    Historical Provider, MD  EPINEPHrine (EPIPEN 2-PAK) 0.3 mg/0.3 mL IJ SOAJ injection Inject 0.3 mLs (0.3 mg total) into the muscle once. 11/13/13   Agapito Gamesatherine D Metheney, MD   Meds Ordered and Administered this Visit  Medications - No data to display  BP 102/62 mmHg  Pulse 40  Temp(Src) 98.2 F (36.8 C) (Oral)  Resp 16  Ht 5\' 5"  (1.651  m)  Wt 172 lb (78.019 kg)  BMI 28.62 kg/m2  SpO2 98% No data found.   Physical Exam Nursing notes and Vital Signs reviewed. Appearance:  Patient appears stated age, and in no acute distress Eyes:  Pupils are equal, round, and reactive to light and accomodation.  Extraocular movement is intact.  Conjunctivae are not inflamed  Ears:  Canals are occluded with cerumen bilaterally.  Post lavage, both canals and tympanic membranes normal. Nose:  Normal Lungs:  Clear to auscultation.   Heart:   Irregular rhythm; rate 44  Skin:  No rash present.   ED Course  Procedures  Bilateral ear lavage by nurse     MDM   1. Cerumen impaction, bilateral    Return prn   Lattie HawStephen A Maansi Wike, MD 02/15/15 1459

## 2015-02-15 NOTE — Discharge Instructions (Signed)
Cerumen Impaction The structures of the external ear canal secrete a waxy substance known as cerumen. Excess cerumen can build up in the ear canal, causing a condition known as cerumen impaction. Cerumen impaction can cause ear pain and disrupt the function of the ear. The rate of cerumen production differs for each individual. In certain individuals, the configuration of the ear canal may decrease his or her ability to naturally remove cerumen. CAUSES Cerumen impaction is caused by excessive cerumen production or buildup. RISK FACTORS  Frequent use of swabs to clean ears.  Having narrow ear canals.  Having eczema.  Being dehydrated. SIGNS AND SYMPTOMS  Diminished hearing.  Ear drainage.  Ear pain.  Ear itch. TREATMENT Treatment may involve:  Over-the-counter or prescription ear drops to soften the cerumen.  Removal of cerumen by a health care provider. This may be done with:  Irrigation with warm water. This is the most common method of removal.  Ear curettes and other instruments.  Surgery. This may be done in severe cases. HOME CARE INSTRUCTIONS  Take medicines only as directed by your health care provider.  Do not insert objects into the ear with the intent of cleaning the ear. PREVENTION  Do not insert objects into the ear, even with the intent of cleaning the ear. Removing cerumen as a part of normal hygiene is not necessary, and the use of swabs in the ear canal is not recommended.  Drink enough water to keep your urine clear or pale yellow.  Control your eczema if you have it. SEEK MEDICAL CARE IF:  You develop ear pain.  You develop bleeding from the ear.  The cerumen does not clear after you use ear drops as directed.   This information is not intended to replace advice given to you by your health care provider. Make sure you discuss any questions you have with your health care provider.   Document Released: 04/15/2004 Document Revised: 03/29/2014  Document Reviewed: 10/23/2014 Elsevier Interactive Patient Education 2016 Elsevier Inc.  

## 2015-02-15 NOTE — ED Notes (Signed)
Reports being here to get ears cleaned out; this is necessary about once a year.

## 2015-08-04 ENCOUNTER — Emergency Department
Admission: EM | Admit: 2015-08-04 | Discharge: 2015-08-04 | Disposition: A | Discharge status: Home / Self Care | Payer: Commercial Managed Care - HMO | Source: Home / Self Care | Attending: Family Medicine | Admitting: Family Medicine

## 2015-08-04 ENCOUNTER — Encounter: Payer: Self-pay | Admitting: Emergency Medicine

## 2015-08-04 DIAGNOSIS — H6123 Impacted cerumen, bilateral: Secondary | ICD-10-CM

## 2015-08-04 NOTE — ED Provider Notes (Signed)
CSN: [id number]     Arrival date & time 08/04/15  0941 History   First MD Initiated Contact with Patient 08/04/15 [phone]     Chief Complaint  Patient presents with  . Cerumen Impaction   (Consider location/radiation/quality/duration/timing/severity/associated sxs/prior Treatment) HPI The pt is a 60yo male presenting to [facility] with c/o bilateral ear fullness mild ear pain/discomfort. Pt notes he has been here several times before for same and is requesting his ears be cleaned out. Hx of recurrent cerumen impaction. He has used OTC earwax softener in the past, but none recently. Denies cough, congestion, sore throat, fever, chills, n/v/d.   Past Medical History  Diagnosis Date  . Osteopenia    Past Surgical History  Procedure Laterality Date  . Appendectomy    . Varicose vein surgery    . Refractive surgery     Family History  Problem Relation Age of Onset  . Throat cancer Father   . Kidney cancer Brother   . Ovarian cancer Sister   . Diabetes Mellitus II Brother   . Diabetes Mellitus II Sister    Social History  Substance Use Topics  . Smoking status: Former Smoker    Types: Cigars  . Smokeless tobacco: None  . Alcohol Use: Yes    Review of Systems  Constitutional: Negative for fever and chills.  HENT: Positive for ear pain ( "fullness"/discomfort) and hearing loss ( minimal). Negative for congestion and sore throat.   Respiratory: Negative for cough and wheezing.   Gastrointestinal: Negative for nausea, vomiting and diarrhea.    Allergies  Bee venom and Nutritional supplements  Home Medications   Prior to Admission medications   Medication Sig Start Date End Date Taking? Authorizing Provider  clotrimazole (LOTRIMIN AF) 1 % cream Apply 1 application topically 2 (two) times daily.    Historical Provider, MD  EPINEPHrine (EPIPEN 2-PAK) 0.3 mg/0.3 mL IJ SOAJ injection Inject 0.3 mLs (0.3 mg total) into the muscle once. 11/13/13   Agapito Games, MD   Meds Ordered and  Administered this Visit  Medications - No data to display  BP 124/68 mmHg  Pulse 73  Temp(Src) 98.2 F (36.8 C) (Oral)  Ht  (1.626 m)  Wt 179 lb (81.194 kg)  BMI 30.71 kg/m2  SpO2 97% No data found.   Physical Exam  Constitutional: He is oriented to person, place, and time. He appears well-developed and well-nourished.  HENT:  Head: Normocephalic and atraumatic.  Right Ear: No swelling or tenderness. Tympanic membrane is scarred. Tympanic membrane is not erythematous and not bulging.  Left Ear: No swelling or tenderness. Tympanic membrane is scarred. Tympanic membrane is not erythematous and not bulging.  Nose: Nose normal.  Mouth/Throat: Uvula is midline, oropharynx is clear and moist and mucous membranes are normal.  Bilateral ears: cerumen impaction, cerumen does appear soft. After cerumen removal: mild erythema of ear canals, mild scaring on TMs.   Eyes: EOM are normal.  Neck: Normal range of motion.  Cardiovascular: Normal rate.   Pulmonary/Chest: Effort normal.  Musculoskeletal: Normal range of motion.  Neurological: He is alert and oriented to person, place, and time.  Skin: Skin is warm and dry.  Psychiatric: He has a normal mood and affect. His behavior is normal.  Nursing note and vitals reviewed.   ED Course  Procedures (including critical care time)  Labs Review Labs Reviewed - No data to display  Imaging Review No results found.    MDM   1. Cerumen impaction, bilateral  Pt presenting with cerumen impaction. Cerumen removed using flushing technique. No evidence of underlying infection. F/u with PCP as needed. Patient verbalized understanding and agreement with treatment plan.     Junius Finnerrin O'Malley, PA-C 08/04/15 1018

## 2015-08-04 NOTE — Discharge Instructions (Signed)
You may try over the counter earwax softener such as Debrox, once a week to help keep ear wax soft and hopefully help prevent it from building up.     Cerumen Impaction The structures of the external ear canal secrete a waxy substance known as cerumen. Excess cerumen can build up in the ear canal, causing a condition known as cerumen impaction. Cerumen impaction can cause ear pain and disrupt the function of the ear. The rate of cerumen production differs for each individual. In certain individuals, the configuration of the ear canal may decrease his or her ability to naturally remove cerumen. CAUSES Cerumen impaction is caused by excessive cerumen production or buildup. RISK FACTORS  Frequent use of swabs to clean ears.  Having narrow ear canals.  Having eczema.  Being dehydrated. SIGNS AND SYMPTOMS  Diminished hearing.  Ear drainage.  Ear pain.  Ear itch. TREATMENT Treatment may involve:  Over-the-counter or prescription ear drops to soften the cerumen.  Removal of cerumen by a health care provider. This may be done with:  Irrigation with warm water. This is the most common method of removal.  Ear curettes and other instruments.  Surgery. This may be done in severe cases. HOME CARE INSTRUCTIONS  Take medicines only as directed by your health care provider.  Do not insert objects into the ear with the intent of cleaning the ear. PREVENTION  Do not insert objects into the ear, even with the intent of cleaning the ear. Removing cerumen as a part of normal hygiene is not necessary, and the use of swabs in the ear canal is not recommended.  Drink enough water to keep your urine clear or pale yellow.  Control your eczema if you have it. SEEK MEDICAL CARE IF:  You develop ear pain.  You develop bleeding from the ear.  The cerumen does not clear after you use ear drops as directed.   This information is not intended to replace advice given to you by your health  care provider. Make sure you discuss any questions you have with your health care provider.   Document Released: 04/15/2004 Document Revised: 03/29/2014 Document Reviewed: 10/23/2014 Elsevier Interactive Patient Education 2016 Elsevier Inc.  Ear Drops, Adult You need to put eardrops in your ear. HOME CARE   Put drops in your affected ear as told.  After putting in the drops, lie down with the ear you put the drops in facing up. Stay this way for 10 minutes. Use the ear drops as long as your doctor tells you.  Before you get up, put a cotton ball gently in your ear. Do not push it far in your ear.  Do not wash out your ears unless your doctor says it is okay.  Finish all medicines as told by your doctor. You may be told to keep using the eardrops even if you start to feel better.  See your doctor as told for follow-up visits. GET HELP IF:  You have pain that gets worse.  Any unusual fluid (drainage) is coming from your ear (especially if the fluid stinks).  You have trouble hearing.  You get really dizzy as if the room is spinning and feel sick to your stomach (vertigo).  The outside of your ear becomes red or puffy or both. This may be a sign of an allergic reaction. MAKE SURE YOU:   Understand these instructions.  Will watch your condition.  Will get help right away if you are not doing well or get  worse.   This information is not intended to replace advice given to you by your health care provider. Make sure you discuss any questions you have with your health care provider.   Document Released: 08/26/2009 Document Revised: 03/29/2014 Document Reviewed: 10/03/2012 Elsevier Interactive Patient Education [employer] Elsevier Inc.

## 2015-08-04 NOTE — ED Notes (Signed)
Bi-lateral cerumen  impaction 

## 2015-09-02 ENCOUNTER — Telehealth: Payer: Self-pay | Admitting: Family Medicine

## 2015-09-02 DIAGNOSIS — Z0000 Encounter for general adult medical examination without abnormal findings: Secondary | ICD-10-CM

## 2015-09-02 DIAGNOSIS — R7301 Impaired fasting glucose: Secondary | ICD-10-CM

## 2015-09-02 NOTE — Telephone Encounter (Signed)
Orders placed and faxed please inform pt.Loralee PacasBarkley, Zakari Couchman [place]

## 2015-09-02 NOTE — Telephone Encounter (Signed)
Pt is coming in for physical on July 14th and would like to get blood work done before the apt Thanks.

## 2015-10-01 LAB — HEMOGLOBIN A1C
Hgb A1c MFr Bld: 5.5 % (ref <5.7)
Mean Plasma Glucose: 111 mg/dL

## 2015-10-02 LAB — COMPLETE METABOLIC PANEL WITH GFR
ALT: 15 U/L (ref 9–46)
AST: 19 U/L (ref 10–35)
Albumin: 3.9 g/dL (ref 3.6–5.1)
Alkaline Phosphatase: 60 U/L (ref 40–115)
BUN: 19 mg/dL (ref 7–25)
CO2: 21 mmol/L (ref 20–31)
Calcium: 8.9 mg/dL (ref 8.6–10.3)
Chloride: 105 mmol/L (ref 98–110)
Creat: 0.88 mg/dL (ref 0.70–1.33)
GFR, Est African American: >89 mL/min (ref >=60)
GLUCOSE: 90 mg/dL (ref 65–99)
POTASSIUM: 4.6 mmol/L (ref 3.5–5.3)
SODIUM: 139 mmol/L (ref 135–146)
Total Bilirubin: 0.8 mg/dL (ref 0.2–1.2)
Total Protein: 6 g/dL — ABNORMAL LOW (ref 6.1–8.1)

## 2015-10-02 LAB — LIPID PANEL
CHOL/HDL RATIO: 3.2 ratio (ref <=5.0)
Cholesterol: 188 mg/dL (ref 125–200)
HDL: 59 mg/dL (ref >=40)
LDL CALC: 118 mg/dL (ref <130)
Triglycerides: 53 mg/dL (ref <150)
VLDL: 11 mg/dL (ref <30)

## 2015-10-02 LAB — PSA: PSA: 2.21 ng/mL (ref <=4.00)

## 2015-10-03 ENCOUNTER — Ambulatory Visit (INDEPENDENT_AMBULATORY_CARE_PROVIDER_SITE_OTHER): Payer: Commercial Managed Care - HMO | Admitting: Family Medicine

## 2015-10-03 ENCOUNTER — Ambulatory Visit (INDEPENDENT_AMBULATORY_CARE_PROVIDER_SITE_OTHER): Payer: Commercial Managed Care - HMO

## 2015-10-03 ENCOUNTER — Encounter: Payer: Self-pay | Admitting: Family Medicine

## 2015-10-03 VITALS — BP 122/84 | HR 77 | Wt 182.0 lb

## 2015-10-03 DIAGNOSIS — Z0189 Encounter for other specified special examinations: Secondary | ICD-10-CM | POA: Diagnosis not present

## 2015-10-03 DIAGNOSIS — M79622 Pain in left upper arm: Secondary | ICD-10-CM

## 2015-10-03 DIAGNOSIS — M79609 Pain in unspecified limb: Secondary | ICD-10-CM

## 2015-10-03 DIAGNOSIS — R7301 Impaired fasting glucose: Secondary | ICD-10-CM

## 2015-10-03 DIAGNOSIS — Z0000 Encounter for general adult medical examination without abnormal findings: Secondary | ICD-10-CM

## 2015-10-03 DIAGNOSIS — M25512 Pain in left shoulder: Secondary | ICD-10-CM | POA: Diagnosis not present

## 2015-10-03 NOTE — Patient Instructions (Signed)
Keep up a regular exercise program and make sure you are eating a healthy diet Try to eat 4 servings of dairy a day, or if you are lactose intolerant take a calcium with vitamin D daily.  Your vaccines are up to date.   

## 2015-10-03 NOTE — Progress Notes (Signed)
Subjective:    CC: CPE  HPI: Here for CPE today.   He does have one concern today. He is complaining of some left upper arm pain. It starts just above his elbow and radiates to the base of his shoulder. It doesn't really go into his shoulder per se. He says it's been going on for about 3-4 months. He says some days it doesn't bother him and other days it bothers him more. He does remember injuring his left shoulder last summer. He was lifting something heavy and felt a pop in his shoulder and actually expressing some numbness and tingling afterwards. It seemed to get better on his own so he never sought care. He says the pain is like a deep ache. It actually often wakes him up at night and he needs he will then get up and take a couple of ibuprofen and try to stretch it out. He says the ibuprofen does seem to help. If he is very physically active but does seem to aggravate it. He has not noticed any limitation in range of motion of the elbow or the shoulder. And no neck pain.   He does occasionally have some midsternal chest pain. It was worked up years ago and he was told that it was a hiatal hernia. He has not had any recent problems with that. Next  He does exercise regularly approximately 6 days per week. Anything very healthy low sugar and low carbohydrate diet. Mostly eats vegetables and does get daily protein intake with me. He did have his blood work done and we will review that today.    Fasting glucose-his repeat A1c was down to 5.5 which is fantastic.  BP 122/84 mmHg  Pulse 77  Wt 182 lb (82.555 kg)  SpO2 97%    Allergies  Allergen Reactions  . Bee Venom   . Nutritional Supplements     Needs epi-pen rx    Past Medical History  Diagnosis Date  . Osteopenia     Past Surgical History  Procedure Laterality Date  . Appendectomy    . Varicose vein surgery    . Refractive surgery      Social History   Social History  . Marital Status: Single    Spouse Name: Montey Hora  .  Number of Children: 1  . Years of Education: N/A   Occupational History  . Associate Professor Tobacco   Social History Main Topics  . Smoking status: Former Smoker    Types: Cigars  . Smokeless tobacco: Not on file  . Alcohol Use: Yes  . Drug Use: No  . Sexual Activity:    Partners: Female   Other Topics Concern  . 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  He is married to [place] with 1 child.     Family History  Problem Relation Age of Onset  . Throat cancer Father   . Kidney cancer Brother   . Ovarian cancer Sister   . Diabetes Mellitus II Brother   . Diabetes Mellitus II Sister     Outpatient Encounter Prescriptions as of 10/03/2015  Medication Sig  . clotrimazole (LOTRIMIN AF) 1 % cream Apply 1 application topically 2 (two) times daily.  [name] EPINEPHrine (EPIPEN 2-PAK) 0.3 mg/0.3 mL IJ SOAJ injection Inject 0.3 mLs (0.3 mg total) into the muscle once.   No facility-administered encounter medications on file as of 10/03/2015.        Review  of Systems: No fevers, chills, night sweats, weight loss, chest pain, or shortness of breath.   Objective:    Physical Exam  Constitutional: He is oriented to person, place, and time. He appears well-developed and well-nourished.  HENT:  Head: Normocephalic and atraumatic.  Right Ear: External ear normal.  Left Ear: External ear normal.  Nose: Nose normal.  Mouth/Throat: Oropharynx is clear and moist.  Eyes: Conjunctivae and EOM are normal. Pupils are equal, round, and reactive to light.  Neck: Neck supple. No tracheal deviation present. No thyromegaly present.  Cardiovascular: Normal rate, regular rhythm and normal heart sounds.   Pulmonary/Chest: Effort normal and breath sounds normal.  Abdominal: Soft. Bowel sounds are normal. He exhibits no mass. There is no rebound and no guarding. No hernia.  Musculoskeletal: Normal range of motion.  Cervical spine with normal flexion,  extension, rotation right and left and side bending. Left shoulder with normal range of motion. Negative empty can test bilaterally. Some mild pain with internal rotation reaching behind his back. Strength is 5 out of 5 at the shoulder elbow and wrist. Nontender over the shoulder joint itself and nontender over the wrist joint itself. He's able to cross over without any difficulty.  Lymphadenopathy:    He has no cervical adenopathy.  Neurological: He is alert and oriented to person, place, and time. He has normal reflexes.  Skin: Skin is warm and dry.  Psychiatric: He has a normal mood and affect. His behavior is normal.  Vitals reviewed.     Impression and Recommendations:    CPE Keep up a regular exercise program and make sure you are eating a healthy diet Try to eat 4 servings of dairy a day, or if you are lactose intolerant take a calcium with vitamin D daily.  Your vaccines are up to date.   Impaired fasting glucose-hemoglobin A1c down to 5.5 which is absolutely fantastic. Recommend recheck in one year. Next  Left upper arm pain-we'll get x-rays today to evaluate for any tumor growth or significant arthritis that could be causing his pain is waking him up at night. If otherwise negative then recommend he follow-up with one of our sports medicine doctors for further evaluation.

## 2015-12-18 ENCOUNTER — Ambulatory Visit (INDEPENDENT_AMBULATORY_CARE_PROVIDER_SITE_OTHER): Payer: Commercial Managed Care - HMO | Admitting: Family Medicine

## 2015-12-18 ENCOUNTER — Encounter: Payer: Self-pay | Admitting: Family Medicine

## 2015-12-18 DIAGNOSIS — M25512 Pain in left shoulder: Secondary | ICD-10-CM | POA: Diagnosis not present

## 2015-12-18 DIAGNOSIS — M75100 Unspecified rotator cuff tear or rupture of unspecified shoulder, not specified as traumatic: Secondary | ICD-10-CM | POA: Insufficient documentation

## 2015-12-18 HISTORY — DX: Unspecified rotator cuff tear or rupture of unspecified shoulder, not specified as traumatic: M75.100

## 2015-12-18 NOTE — Patient Instructions (Signed)
Thank you for coming in today. You should hear about MRI scheduling soon.  Return a few days after MRI to go over findings.

## 2015-12-18 NOTE — Progress Notes (Signed)
   Subjective:    I'm seeing this patient as a consultation for:  Dr. Linford ArnoldMetheney  CC: Left arm pain  HPI: 60 year old male presents with left arm pain.  Patient initially injured his arm in April when he was lifting something heavy.  He heard a "pop" and felt numb all the way down his left arm.  Since then the numbness has resolved but his left lateral arm has become painful.  He describes the pain as sharp.  He will occasionally feel the pain in his left shoulder and elbow, but claims it is most prominent in his left lateral arm.  The pain is worse with overhead movements, reaching behind his body, and during the night while sleeping.  Patient claims the pain wakes him up 3-4 times per night.  Thus far, he has tried a trial of rest, home exercise therapy, and NSAIDs without improvement.  His pain has been worsening since April.  He endorses weakness with left hand grip strength.  Denies numbness, tingling, and radicular pain.   No recent injuries.  Past medical history, Surgical history, Family history not pertinant except as noted below, Social history, Allergies, and medications have been entered into the medical record, reviewed, and no changes needed.   Review of Systems: No headache, visual changes, nausea, vomiting, diarrhea, constipation, dizziness, abdominal pain, skin rash, fevers, chills, night sweats, weight loss, swollen lymph nodes, body aches, joint swelling, muscle aches, chest pain, shortness of breath, mood changes, visual or auditory hallucinations.   Objective:    Vitals:   12/18/15 0920  BP: 119/74  Pulse: 76   General: Well Developed, well nourished, and in no acute distress.  Neuro/Psych: Alert and oriented x3, extra-ocular muscles intact, able to move all 4 extremities, sensation grossly intact. Skin: Warm and dry, no rashes noted.  Respiratory: Not using accessory muscles, speaking in full sentences, trachea midline.  Cardiovascular: Pulses palpable, no extremity  edema. Abdomen: Does not appear distended.  MSK: left shoulder:  Normal appearing without swelling or deformity Non-tender to palpation of shoulder and elbow Decreased range of motion secondary to pain with internal rotation. Full range of motion with shoulder flexion, external rotation, and abduction. Increased pain with reaching overhead Equal strength bilaterally Positive Empty Can test Positive Hawkins test,  Negative Spurling Negative Speeds and Yergason   No results found for this or any previous visit (from the past 24 hour(s)). No results found.  Impression and Recommendations:    Assessment and Plan: 60 y.o. male with left lateral arm pain, concerning for rotator cuff pathology.  His pain is worsening despite trials of rest, home exercises, and NSAID therapy.  He does not wish to receive a steroid injection at this time.   - left shoulder MRI - Follow up a few days later to go over results and consider therapy options   Discussed warning signs or symptoms. Please see discharge instructions. Patient expresses understanding.

## 2015-12-29 ENCOUNTER — Other Ambulatory Visit: Payer: Self-pay | Admitting: Family Medicine

## 2015-12-29 ENCOUNTER — Ambulatory Visit (INDEPENDENT_AMBULATORY_CARE_PROVIDER_SITE_OTHER): Payer: Commercial Managed Care - HMO

## 2015-12-29 DIAGNOSIS — M7552 Bursitis of left shoulder: Secondary | ICD-10-CM | POA: Diagnosis not present

## 2015-12-29 DIAGNOSIS — M25512 Pain in left shoulder: Secondary | ICD-10-CM

## 2015-12-29 DIAGNOSIS — Z1389 Encounter for screening for other disorder: Secondary | ICD-10-CM | POA: Diagnosis not present

## 2016-01-05 ENCOUNTER — Ambulatory Visit (INDEPENDENT_AMBULATORY_CARE_PROVIDER_SITE_OTHER): Payer: Commercial Managed Care - HMO | Admitting: Family Medicine

## 2016-01-05 ENCOUNTER — Encounter: Payer: Self-pay | Admitting: Family Medicine

## 2016-01-05 VITALS — BP 106/66 | HR 72 | Wt 176.0 lb

## 2016-01-05 DIAGNOSIS — M75112 Incomplete rotator cuff tear or rupture of left shoulder, not specified as traumatic: Secondary | ICD-10-CM

## 2016-01-05 MED ORDER — NAPROXEN 500 MG PO TABS: 500.0000 mg | ORAL_TABLET | Freq: Every evening | ORAL | 2 refills | Status: DC | PRN

## 2016-01-05 MED ORDER — NITROGLYCERIN 0.2 MG/HR TD PT24: MEDICATED_PATCH | TRANSDERMAL | 1 refills | Status: DC

## 2016-01-05 NOTE — Progress Notes (Signed)
Nicolas OppenheimMichael L Boone is a 60 y.o. male who [facility] today for follow up left shoulder pain.   Patient has had left shoulder pain ongoing for several months. He recently had an MRI that showed partial thickness supraspinatus rotator cuff tear. He continues to experience daily pain specialist bothersome at night and with overhand motion. The pain is interfering with his normal daily functioning. He notes he gets woken up multiple times at night due to the pain. He's used some naproxen which has made a difference.   Past Medical History:  Diagnosis Date  . Osteopenia   . Rotator cuff tear 12/18/2015   Past Surgical History:  Procedure Laterality Date  . APPENDECTOMY    . REFRACTIVE SURGERY    . VARICOSE VEIN SURGERY     Social History  Substance Use Topics  . Smoking status: Former Smoker    Types: Cigars  . Smokeless tobacco: Not on file  . Alcohol use Yes   family history includes Diabetes Mellitus II in his brother and sister; Kidney cancer in his brother; Ovarian cancer in his sister; Throat cancer in his father.  ROS as above:  Medications: Current Outpatient Prescriptions  Medication Sig Dispense Refill  . clotrimazole (LOTRIMIN AF) 1 % cream Apply 1 application topically 2 (two) times daily.    [name]. EPINEPHrine (EPIPEN 2-PAK) 0.3 mg/0.3 mL IJ SOAJ injection Inject 0.3 mLs (0.3 mg total) into the muscle once. 2 Device 1  . naproxen (NAPROSYN) 500 MG tablet Take 1 tablet (500 mg total) by mouth at bedtime as needed for moderate pain. 30 tablet 2  . nitroGLYCERIN (NITRODUR - DOSED IN MG/24 HR) 0.2 mg/hr patch 1/4 patch to left shoulder daily for tendonitis 30 patch 1   No current facility-administered medications for this visit.    Allergies  Allergen Reactions  . Bee Venom   . Nutritional Supplements     Needs epi-pen rx    Health  Maintenance Health Maintenance  Topic Date Due  . INFLUENZA VACCINE  10/21/2015  . TETANUS/TDAP  10/02/2016 (Originally 01/03/1976)  . COLONOSCOPY  03/09/2023  . Hepatitis C Screening  Completed  . HIV Screening  Completed     Exam:  BP 106/66   Pulse 72   Wt 176 lb (79.8 kg)   BMI 30.21 kg/m  Gen: Well NAD Left shoulder: Pain with abduction.  Study Result   CLINICAL DATA:  Left shoulder pain.  Lifting injury June 2016.  EXAM: MRI OF THE LEFT SHOULDER WITHOUT CONTRAST  TECHNIQUE: Multiplanar, multisequence MR imaging of the shoulder was performed. No intravenous contrast was administered.  COMPARISON:  None.  FINDINGS: Rotator cuff: Focal severe tendinosis of the supraspinatus tendon with a high-grade partial-thickness, near full-thickness, bursal surface tear measuring 6 mm in anterior- posterior dimension. Infraspinatus tendon is intact. Teres minor tendon is intact. Subscapularis tendon is intact.  Muscles: No atrophy or fatty replacement of nor abnormal signal within, the muscles of the rotator cuff.  Biceps long head:  Intact.  Acromioclavicular Joint: Normal acromioclavicular joint. Type II acromion. Small amount of subacromial/subdeltoid bursal fluid.  Glenohumeral Joint: No joint effusion. Partial-thickness cartilage loss of the superior glenoid.  Labrum: Grossly intact, but evaluation is limited by lack of intraarticular fluid and patient motion.  Bones: Mild subcortical reactive marrow changes at the supraspinatus insertion. No other focal marrow signal abnormality. No fracture or dislocation.  Other: No fluid collection or hematoma.  IMPRESSION: 1. Focal severe tendinosis of the supraspinatus tendon with a high-grade partial-thickness, near full-thickness, bursal surface tear measuring 6 mm in anterior- posterior dimension. 2. Mild subacromial/subdeltoid bursitis.   Electronically Signed   By: Elige Ko   On: 12/29/2015  10:14        Assessment and Plan: 60 y.o. male with Partial thickness rotator cuff tear with tendinitis of the left supraspinatus. Discussed options. Patient would like to avoid steroids at this time which I agree. Will use nitroglycerin patch protocol along with physical therapy and naproxen. Recheck in one month.   Orders Placed This Encounter  Procedures  . Ambulatory referral to Physical Therapy    Referral Priority:   Routine    Referral Type:   Physical Medicine    Referral Reason:   Specialty Services Required    Requested Specialty:   Physical Therapy    Number of Visits Requested:   1    Discussed warning signs or symptoms. Please see discharge instructions. Patient expresses understanding.

## 2016-01-05 NOTE — Patient Instructions (Signed)
Thank you for coming in today. Attend PT.  Use nitro patches.  Return in 1 month.   Nitroglycerin Protocol   Apply 1/4 nitroglycerin patch to affected area daily.  Change position of patch within the affected area every 24 hours.  You may experience a headache during the first 1-2 weeks of using the patch, these should subside.  If you experience headaches after beginning nitroglycerin patch treatment, you may take your preferred over the counter pain reliever.  Another side effect of the nitroglycerin patch is skin irritation or rash related to patch adhesive.  Please notify our office if you develop more severe headaches or rash, and stop the patch.  Tendon healing with nitroglycerin patch may require 12 to 24 weeks depending on the extent of injury.  Men should not use if taking Viagra, Cialis, or Levitra.   Do not use if you have migraines or rosacea.

## 2016-01-12 ENCOUNTER — Ambulatory Visit: Payer: Commercial Managed Care - HMO | Admitting: Rehabilitative and Restorative Service Providers"

## 2016-01-14 ENCOUNTER — Ambulatory Visit (INDEPENDENT_AMBULATORY_CARE_PROVIDER_SITE_OTHER): Payer: 59 | Admitting: Physical Therapy

## 2016-01-14 ENCOUNTER — Encounter: Payer: Self-pay | Admitting: Physical Therapy

## 2016-01-14 DIAGNOSIS — R531 Weakness: Secondary | ICD-10-CM

## 2016-01-14 DIAGNOSIS — M25512 Pain in left shoulder: Secondary | ICD-10-CM | POA: Diagnosis not present

## 2016-01-14 DIAGNOSIS — R293 Abnormal posture: Secondary | ICD-10-CM | POA: Diagnosis not present

## 2016-01-14 DIAGNOSIS — G8929 Other chronic pain: Secondary | ICD-10-CM

## 2016-01-14 NOTE — Patient Instructions (Addendum)
Trigger Point Dry Needling  . What is Trigger Point Dry Needling (DN)? o DN is a physical therapy technique used to treat muscle pain and dysfunction. Specifically, DN helps deactivate muscle trigger points (muscle knots).  o A thin filiform needle is used to penetrate the skin and stimulate the underlying trigger point. The goal is for a local twitch response (LTR) to occur and for the trigger point to relax. No medication of any kind is injected during the procedure.   . What Does Trigger Point Dry Needling Feel Like?  o The procedure feels different for each individual patient. Some patients report that they do not actually feel the needle enter the skin and overall the process is not painful. Very mild bleeding may occur. However, many patients feel a deep cramping in the muscle in which the needle was inserted. This is the local twitch response.   [name]. How Will I feel after the treatment? o Soreness is normal, and the onset of soreness may not occur for a few hours. Typically this soreness does not last longer than two days.  o Bruising is uncommon, however; ice can be used to decrease any possible bruising.  o In rare cases feeling tired or nauseous after the treatment is normal. In addition, your symptoms may get worse before they get better, this period will typically not last longer than 24 hours.   . What Can I do After My Treatment? o Increase your hydration by drinking more water for the next 24 hours. o You may place ice or heat on the areas treated that have become sore, however, do not use heat on inflamed or bruised areas. Heat often brings more relief post needling. o You can continue your regular activities, but vigorous activity is not recommended initially after the treatment for 24 hours. o DN is best combined with other physical therapy such as strengthening, stretching, and other therapies.     * Use ice on Left shoulder for pain.    Head Press With [facility] Tuck - lie on  back, knees bent, palms facing up, pillow under head.     Tuck chin SLIGHTLY toward chest, keep mouth closed. Feel weight on back of head. Increase weight by pressing head down. Hold _5__ seconds. Relax. Repeat _10__ times. Surface: floor    Shoulder Press - pillow under head    Press both shoulders down. Hold __5_ seconds. Repeat _10__ times.  Surface: floor   Scapular Retraction: Abduction / Extension (Prone)    Lie with arms out from sides 90. Pinch shoulder blades together and raise arms a few inches from floor. Repeat __10__ times per set. Do __3__ sets per session. Do __1__ sessions per day.  Resisted External Rotation: in Neutral - Bilateral - keep chest lifted, shoulders back    Sit or stand, tubing in both hands, elbows at sides, bent to 90, forearms forward. Pinch shoulder blades together and rotate forearms out. Keep elbows at sides. Repeat __10__ times per set. Do __3__ sets per session. Do _1___ sessions per day.  Strengthening: Resisted External Rotation - towel under Left elbow   Hold tubing in right hand, elbow at side and forearm across body. Rotate forearm out. Repeat __10__ times per set. Do __3__ sets per session. Do __1__ sessions per day.  Scapula Adduction With Pectorals, Low   Stand in doorframe with palms against frame and arms at 45. Lean forward and squeeze shoulder blades. Hold _30-45__ seconds. Repeat _1__ times per session. Do _1-2__  sessions per day.  Scapula Adduction With Pectorals, Mid-Range   Stand in doorframe with palms against frame and arms at 90. Lean forward and squeeze shoulder blades. Hold _30-45__ seconds. Repeat _1 times per session. Do _1-2__ sessions per day Copyright  VHI. All rights reserved.

## 2016-01-14 NOTE — Therapy (Signed)
[facility] [address] [place], [place], [address] Phone: [phone]   Fax:  [phone]  Physical Therapy Evaluation  Patient Details  Name: Nicolas OppenheimMichael L Boone MRN: [id number] Date of Birth: 06/12/1955 Referring Provider: Dr Teressa LowerE Corey  Encounter Date: 01/14/2016      PT End of Session - 01/14/16 0800    Visit Number 1   Number of Visits 6   Date for PT Re-Evaluation 02/25/16   PT Start Time 0800   PT Stop Time 0850   PT Time Calculation (min) 50 min      Past Medical History:  Diagnosis Date  . Osteopenia   . Rotator cuff tear 12/18/2015    Past Surgical History:  Procedure Laterality Date  . APPENDECTOMY    . REFRACTIVE SURGERY    . VARICOSE VEIN SURGERY      There were no vitals filed for this visit.       Subjective Assessment - 01/14/16 0800    Subjective Casimiro NeedleMichael reports he was lifting some heavy things while working in his yard this past spring ( about 6 months ), he heard a pop. He thought the pain would settle down and it didn't. He reported it to him MD during his physical, seen by sports MD  had MRI and saw a partial tear. Now the pain is interfering with his sleep.  exercises almost every day, ellipitcal and running.    Diagnostic tests MRI, x-rays - supraspinatus partial tear   Patient Stated Goals sleep more than 2 hrs, don seatbelt and back pack without pain, not have surgery   Currently in Pain? Yes   Pain Score 2    Pain Location Shoulder   Pain Orientation Left   Pain Descriptors / Indicators Aching   Pain Type Chronic pain   Pain Radiating Towards into deltoid   Pain Onset More than a month ago   Pain Frequency Intermittent   Aggravating Factors  sleeping, pressure on Lt side, grad seat belt, ice makes the arm hurt   Pain Relieving Factors place arm in a position where he doesn't have pain.             [facility] - 01/14/16 0001      Assessment   Medical Diagnosis  Partial tear Lt RTC   Referring Provider Dr Teressa LowerE Corey   Onset Date/Surgical Date 07/15/15   Hand Dominance Right   Next MD Visit Nov some time   Prior Therapy none     Precautions   Precautions None     Balance Screen   Has the patient fallen in the past 6 months No   Has the patient had a decrease in activity level because of a fear of falling?  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    Leisure exercise, went back to school  ( almost done)      Observation/Other Assessments   Focus on Therapeutic Outcomes (FOTO)  50% limited     Posture/Postural Control   Posture/Postural Control Postural limitations   Postural Limitations Rounded Shoulders;Forward head;Increased thoracic kyphosis     ROM / Strength   AROM / PROM / Strength AROM;Strength     AROM   AROM  Assessment Site Shoulder;Cervical   Right/Left Shoulder Left  Rt WNL, Lt WNL with pain except reaching behind back   Left Shoulder Internal Rotation 88 Degrees   Left Shoulder External Rotation 85 Degrees   Cervical Flexion WNl   Cervical Extension WNL   Cervical - Right Side Bend WNL   Cervical - Left Side Bend WNL   Cervical - Right Rotation WNL   Cervical - Left Rotation WNL     Strength   Overall Strength Comments mid traps Lt 4+/5, Rt 5/5   Strength Assessment Site Shoulder;Elbow   Right/Left Shoulder Left  Rt WNl   Left Shoulder Flexion 5/5   Left Shoulder ABduction 4-/5  with pain   Left Shoulder Internal Rotation 5/5   Left Shoulder External Rotation 3+/5  with pain   Right/Left Elbow --  bilat WNL     Flexibility   Soft Tissue Assessment /Muscle Length --  very tight pecs bilat     Palpation   Spinal mobility hypomobile in T spine, grade II mobs    Palpation  comment tightness in his Lt shoulder  and anterior chest     Special Tests    Special Tests Rotator Cuff Impingement   Rotator Cuff Impingment tests Leanord Asal test     Hawkins-Kennedy test   Findings Positive   Side Left                   [facility] Adult PT Treatment/Exercise - 01/14/16 0001      Exercises   Exercises Shoulder     Shoulder Exercises: Supine   Other Supine Exercises 10 reps , 5 sec head presses, shoulder presses     Shoulder Exercises: Prone   Other Prone Exercises 3x10 T's      Shoulder Exercises: Standing   External Rotation Strengthening;Left;Theraband  3x10 towel under elbow   Theraband Level (Shoulder External Rotation) Level 2 (Red)   Other Standing Exercises 3x10 bilat scap retraction with ER red band     Shoulder Exercises: Stretch   Other Shoulder Stretches 30 sec doorway stretch low/mid                PT Education - 01/14/16 0841    Education provided Yes   Education Details HEP, shoulder mechanics    Person(s) Educated Patient   Methods Demonstration;Explanation;Handout   Comprehension Returned demonstration;Verbalized understanding             PT Long Term Goals - 01/14/16 0906      PT LONG TERM GOAL #1   Title I with advanced HEP ( 02/25/16)    Time 6   Period Weeks   Status New     PT LONG TERM GOAL #2   Title demo painfree Lt shoulder motion - reaching for seatbelt, donning coat and reaching behind back ( 02/25/16)    Time 6   Period Weeks   Status New     PT LONG TERM GOAL #3   Title sleep per his previous level without Lt shoulder waking him ( 02/25/16)    Time 6   Period Weeks   Status New     PT LONG TERM GOAL #4   Title demo Lt shoulder strength =/> 5-/5 without pain ( 02/25/16)    Time 6   Period Weeks   Status New     PT LONG TERM GOAL #5   Title improve FOTO =/< 31% limited ( 02/25/16)    Time 6   Period  Weeks   Status New               Plan - 01/14/16 0903    Clinical  Impression Statement 60 yo male presents with 6 month h/o Lt shoulder pain.  He has significant postural changes that are very common in people with osteoporosis that place the shoulders in poor alignment and can lead to shoulder issues.  He has pain with AROM and weakness in the Lt shoulder as well as very tight pecs.  Harris wishes to have exercises to perform at home and limit the number of times he comes in for formal sessions.    Rehab Potential Good   PT Frequency 1x / week   PT Duration 6 weeks   PT Treatment/Interventions Ultrasound;Therapeutic exercise;Dry needling;Taping;Vasopneumatic Device;Manual techniques;Cryotherapy;Electrical Stimulation;Patient/family education   PT Next Visit Plan possible TDN to pecs, RTC strengthening, chest openers, gentle thoracic mobs or self mobs   Consulted and Agree with Plan of Care Patient      Patient will benefit from skilled therapeutic intervention in order to improve the following deficits and impairments:  Postural dysfunction, Decreased strength, Pain, Impaired UE functional use, Increased muscle spasms  Visit Diagnosis: Chronic left shoulder pain - Plan: PT plan of care cert/re-cert  Weakness generalized - Plan: PT plan of care cert/re-cert  Abnormal posture - Plan: PT plan of care cert/re-cert     Problem List Patient Active Problem List   Diagnosis Date Noted  . Rotator cuff tear 12/18/2015  . Varicose vein of leg 11/15/2014  . IFG (impaired fasting glucose) 04/25/2014  . Osteoporosis 11/21/2013  . Hiatal hernia 11/13/2013  . 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 BACK, LOWER, PAIN 12/23/2009    Roderic Scarce PT 01/14/2016, 9:10 AM  [facility] [address] [place], [place], [address] Phone: [phone]   Fax:  [phone]  Name: SYED ZUKAS MRN: [id number] Date of Birth: 18-Jun-1955

## 2016-01-23 ENCOUNTER — Ambulatory Visit (INDEPENDENT_AMBULATORY_CARE_PROVIDER_SITE_OTHER): Payer: 59 | Admitting: Physical Therapy

## 2016-01-23 DIAGNOSIS — R531 Weakness: Secondary | ICD-10-CM

## 2016-01-23 DIAGNOSIS — M25512 Pain in left shoulder: Secondary | ICD-10-CM

## 2016-01-23 DIAGNOSIS — G8929 Other chronic pain: Secondary | ICD-10-CM | POA: Diagnosis not present

## 2016-01-23 DIAGNOSIS — R293 Abnormal posture: Secondary | ICD-10-CM

## 2016-01-23 NOTE — Therapy (Signed)
[facility] [address] [place] [address] [place], [place], [address] Phone: [phone]   Fax:  [phone]  Physical Therapy Treatment  Patient Details  Name: Nicolas OppenheimMichael L Maltese MRN: [id number] Date of Birth: Aug 15, 1955 Referring Provider: Dr Teressa LowerE Corey  Encounter Date: 01/23/2016      PT End of Session - 01/23/16 1008    Visit Number 2   Number of Visits 6   Date for PT Re-Evaluation 02/25/16   PT Start Time 0801   PT Stop Time 0848   PT Time Calculation (min) 47 min   Activity Tolerance Patient tolerated treatment well;No increased pain   Behavior During Therapy WFL for tasks assessed/performed      Past Medical History:  Diagnosis Date  . Osteopenia   . Rotator cuff tear 12/18/2015    Past Surgical History:  Procedure Laterality Date  . APPENDECTOMY    . REFRACTIVE SURGERY    . VARICOSE VEIN SURGERY      There were no vitals filed for this visit.      Subjective Assessment - 01/23/16 0804    Subjective Having good and bad days yet, trying to do home exercises. One in the doorframe kills me.    Currently in Pain? Yes   Pain Score 1    Pain Location Shoulder   Pain Orientation Left   Pain Descriptors / Indicators Aching;Numbness   Aggravating Factors  sleeping can't find a comfortable position   Pain Relieving Factors change positions                         [facility] - 01/23/16 0001      Posture/Postural Control   Posture/Postural Control Postural limitations   Postural Limitations Rounded Shoulders;Forward head;Increased thoracic kyphosis   Posture Comments multimodal cues for posture     Lumbar Exercises: Aerobic   UBE (Upper Arm Bike) L1 X5 min (2 fwd, 3 backward)     Shoulder Exercises: Supine   Other Supine Exercises shoulder horizontal abduction chest stretch     Shoulder Exercises: Seated   Row Strengthening;Both;10 reps   Theraband Level (Shoulder Row) Level 1  (Yellow)   Row Limitations cues needed for technique   External Rotation Strengthening;Left;10 reps   Theraband Level (Shoulder External Rotation) Level 1 (Yellow)   External Rotation Limitations cues needed for technique     Shoulder Exercises: Prone   Horizontal ABduction 1 Strengthening   Horizontal ABduction 1 Weight (lbs) 0   Horizontal ABduction 1 Limitations 1X10     Shoulder Exercises: Stretch   Corner Stretch 3 reps;20 seconds   Corner Stretch Limitations to start low and progress up as tolerated, to avoid pain                PT Education - 01/23/16 1007    Education provided Yes   Education Details HEP, relationship between posture/strength and rotator cuff pathology.    Person(s) Educated Patient   Methods Explanation;Demonstration;Tactile cues;Verbal cues;Handout   Comprehension Verbalized understanding;Returned demonstration             PT Long Term Goals - 01/23/16 [id number]      PT LONG TERM GOAL #1   Title I with advanced HEP ( 02/25/16)    Time 6   Period Weeks   Status On-going     PT LONG TERM GOAL #2   Status On-going     PT LONG TERM GOAL #3   Title  sleep per his previous level without Lt shoulder waking him ( 02/25/16)    Time 6   Period Weeks   Status On-going     PT LONG TERM GOAL #4   Title demo Lt shoulder strength =/> 5-/5 without pain ( 02/25/16)    Time 6   Period Weeks   Status On-going     PT LONG TERM GOAL #5   Title improve FOTO =/< 31% limited ( 02/25/16)    Time 6   Period Weeks   Status On-going               Plan - 01/23/16 1028    Clinical Impression Statement Pt presents with continued difficulty with posture and limitations in Lt shoulder strength and ROM. Emphasis was placed on reviewing and modifying the HEP in addition to education on rational for impoved compliance. PT to continue with home and clinic based exercise program with manual therapy and modalities as needed.    Rehab Potential Good   PT  Frequency 1x / week   PT Duration 6 weeks   PT Treatment/Interventions Ultrasound;Therapeutic exercise;Dry needling;Taping;Vasopneumatic Device;Manual techniques;Cryotherapy;Electrical Stimulation;Patient/family education   PT Next Visit Plan Review and progress HEP, consider jt mobs for LT shoulder - stress posture and scapular stabilization.    Consulted and Agree with Plan of Care Patient      Patient will benefit from skilled therapeutic intervention in order to improve the following deficits and impairments:  Postural dysfunction, Decreased strength, Pain, Impaired UE functional use, Increased muscle spasms  Visit Diagnosis: Chronic left shoulder pain  Weakness generalized  Abnormal posture     Problem List Patient Active Problem List   Diagnosis Date Noted  . Rotator cuff tear 12/18/2015  . Varicose vein of leg 11/15/2014  . IFG (impaired fasting glucose) 04/25/2014  . Osteoporosis 11/21/2013  . Hiatal hernia 11/13/2013  . 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 BACK, LOWER, PAIN 12/23/2009    Delton SeeBenjamin Kapena Hamme, PT, CSCS 01/23/2016, 10:45 AM  [facility] [address] [place] [address] [place], [place], [address] Phone: [phone]   Fax:  [phone]  Name: Nicolas OppenheimMichael L Kooi MRN: [id number] Date of Birth: 1956/01/23

## 2016-01-30 ENCOUNTER — Encounter: Payer: Self-pay | Admitting: Physical Therapy

## 2016-01-30 ENCOUNTER — Ambulatory Visit (INDEPENDENT_AMBULATORY_CARE_PROVIDER_SITE_OTHER): Payer: Commercial Managed Care - HMO | Admitting: Physical Therapy

## 2016-01-30 DIAGNOSIS — R531 Weakness: Secondary | ICD-10-CM

## 2016-01-30 DIAGNOSIS — R293 Abnormal posture: Secondary | ICD-10-CM

## 2016-01-30 DIAGNOSIS — M25512 Pain in left shoulder: Secondary | ICD-10-CM

## 2016-01-30 DIAGNOSIS — G8929 Other chronic pain: Secondary | ICD-10-CM | POA: Diagnosis not present

## 2016-01-30 NOTE — Therapy (Signed)
[facility] [address] [place] [address] [place], [place], [address] Phone: [phone]   Fax:  [phone]  Physical Therapy Treatment  Patient Details  Name: Nicolas OppenheimMichael L Boone MRN: [id number] Date of Birth: Mar 10, 1956 Referring Provider: Dr Teressa LowerE Corey  Encounter Date: 01/30/2016      PT End of Session - 01/30/16 1316    Visit Number 3   Date for PT Re-Evaluation 02/25/16   PT Start Time 1022   PT Stop Time 1105   PT Time Calculation (min) 43 min   Activity Tolerance Patient tolerated treatment well   Behavior During Therapy [facility] for tasks assessed/performed      Past Medical History:  Diagnosis Date  . Osteopenia   . Rotator cuff tear 12/18/2015    Past Surgical History:  Procedure Laterality Date  . APPENDECTOMY    . REFRACTIVE SURGERY    . VARICOSE VEIN SURGERY      There were no vitals filed for this visit.      Subjective Assessment - 01/30/16 1025    Subjective Really sore yesterday for some reason but not sure why. Doing much better today thought. Using a pillow under his arm when he is lying down, support seems to help.    Currently in Pain? No/denies                         [facility]/Exercise - 01/30/16 0001      Posture/Postural Control   Posture/Postural Control Postural limitations   Postural Limitations Rounded Shoulders;Forward head;Increased thoracic kyphosis   Posture Comments multimodal cues for posture     Lumbar Exercises: Aerobic   UBE (Upper Arm Bike) L1 X5 min (2.5 fwd, 2.5 backward)     Shoulder Exercises: Supine   Other Supine Exercises isometric IR/ER each 2X10 at 0/30 degrees abduction     Shoulder Exercises: Seated   External Rotation Strengthening;Left;10 reps   Theraband Level (Shoulder External Rotation) Level 1 (Yellow)   External Rotation Limitations 2X10, towel for adduction cue   Internal Rotation Strengthening;Left;10 reps   Theraband Level  (Shoulder Internal Rotation) Level 2 (Red)   Internal Rotation Limitations 2x10, towel for adduction cue     Shoulder Exercises: Prone   Extension Strengthening;Left;10 reps   Horizontal ABduction 1 Strengthening   Horizontal ABduction 1 Weight (lbs) 0   Horizontal ABduction 1 Limitations 1X10   Other Prone Exercises Rows 5# 1X10     Manual Therapy   Manual Therapy Joint mobilization   Manual therapy comments grade 3 destraction/inferior glides Lt GH joint                PT Education - 01/30/16 1316    Education provided Yes   Education Details HEP   Person(s) Educated Patient   Methods Explanation;Demonstration;Tactile cues;Verbal cues;Handout   Comprehension Verbalized understanding;Returned demonstration             PT Long Term Goals - 01/30/16 1044      PT LONG TERM GOAL #1   Title I with advanced HEP ( 02/25/16)    Time 6   Period Weeks   Status On-going     PT LONG TERM GOAL #2   Title demo painfree Lt shoulder motion - reaching for seatbelt, donning coat and reaching behind back ( 02/25/16)    Time 6   Period Weeks   Status On-going     PT LONG TERM GOAL #3   Title  sleep per his previous level without Lt shoulder waking him ( 02/25/16)    Time 6   Period Weeks   Status On-going     PT LONG TERM GOAL #4   Title demo Lt shoulder strength =/> 5-/5 without pain ( 02/25/16)    Time 6   Period Weeks   Status On-going     PT LONG TERM GOAL #5   Title improve FOTO =/< 31% limited ( 02/25/16)    Time 6   Period Weeks   Status On-going               Plan - 01/30/16 1317    Clinical Impression Statement Working on scapular stabilization and rotator cuff strengthening. Trialed manual therapy for Lt glenohumeral joint mobilizations. Able to advance HEP without incrased pain, multimodal cues for technique.    Rehab Potential Good   PT Frequency 1x / week   PT Duration 6 weeks   PT Treatment/Interventions Ultrasound;Therapeutic exercise;Dry  needling;Taping;Vasopneumatic Device;Manual techniques;Cryotherapy;Electrical Stimulation;Patient/family education   PT Next Visit Plan Continue with scapular/RTC strengthening. Manual therapy for [facility] joint mobs as needed.    Consulted and Agree with Plan of Care Patient      Patient will benefit from skilled therapeutic intervention in order to improve the following deficits and impairments:  Postural dysfunction, Decreased strength, Pain, Impaired UE functional use, Increased muscle spasms  Visit Diagnosis: Chronic left shoulder pain  Weakness generalized  Abnormal posture     Problem List Patient Active Problem List   Diagnosis Date Noted  . Rotator cuff tear 12/18/2015  . Varicose vein of leg 11/15/2014  . IFG (impaired fasting glucose) 04/25/2014  . Osteoporosis 11/21/2013  . Hiatal hernia 11/13/2013  . 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 BACK, LOWER, PAIN 12/23/2009    Delton SeeBenjamin Italo Banton, PT, CSCS 01/30/2016, 1:20 PM  [facility] [address] [place] [address] [place], [place], [address] Phone: [phone]   Fax:  [phone]  Name: Nicolas OppenheimMichael L Boone MRN: [id number] Date of Birth: May 09, 1955

## 2016-02-06 ENCOUNTER — Encounter: Payer: Self-pay | Admitting: Physical Therapy

## 2016-02-06 ENCOUNTER — Ambulatory Visit (INDEPENDENT_AMBULATORY_CARE_PROVIDER_SITE_OTHER): Payer: Commercial Managed Care - HMO | Admitting: Physical Therapy

## 2016-02-06 DIAGNOSIS — G8929 Other chronic pain: Secondary | ICD-10-CM

## 2016-02-06 DIAGNOSIS — R293 Abnormal posture: Secondary | ICD-10-CM | POA: Diagnosis not present

## 2016-02-06 DIAGNOSIS — R531 Weakness: Secondary | ICD-10-CM | POA: Diagnosis not present

## 2016-02-06 DIAGNOSIS — M25512 Pain in left shoulder: Secondary | ICD-10-CM

## 2016-02-06 NOTE — Therapy (Addendum)
[place] [address] [place] [address] [place], [place], [address] Phone: [phone]   Fax:  [phone]  Physical Therapy Treatment  Patient Details  Name: Nicolas Boone MRN: [id number] Date of Birth: 1955-07-10 Referring Provider: Dr Steva Colder  Encounter Date: 02/06/2016      PT End of Session - 02/06/16 1042    Visit Number 4   Number of Visits 6   Date for PT Re-Evaluation 02/25/16   PT Start Time 0932   PT Stop Time 1015   PT Time Calculation (min) 43 min   Activity Tolerance Patient tolerated treatment well   Behavior During Therapy [facility] for tasks assessed/performed      Past Medical History:  Diagnosis Date  . Osteopenia   . Rotator cuff tear 12/18/2015    Past Surgical History:  Procedure Laterality Date  . APPENDECTOMY    . REFRACTIVE SURGERY    . VARICOSE VEIN SURGERY      There were no vitals filed for this visit.      Subjective Assessment - 02/06/16 0934    Subjective Was doing great yesterday, didn't have to take any pain meds. Did the elliptical this morning and that got it irritated again but seems to be calming down. Seems to be getting sore when playing the guitar.     Currently in Pain? No/denies   Pain Location Shoulder   Pain Orientation Left   Pain Descriptors / Indicators --  twinging occasionally but pain.                          [place] Adult PT Treatment/Exercise - 02/06/16 0001      Lumbar Exercises: Aerobic   UBE (Upper Arm Bike) L1 X5 min (2.5 fwd, 2.5 backward)     Shoulder Exercises: Supine   Other Supine Exercises isometric IR/ER each 2X10 at 0/30 degrees abduction     Shoulder Exercises: Seated   Row Strengthening;Both;10 reps   Theraband Level (Shoulder Row) Level 2 (Red)   External Rotation Strengthening;Left;10 reps   Theraband Level (Shoulder External Rotation) Level 2 (Red)   Internal Rotation Strengthening;Left;10 reps   Theraband Level (Shoulder  Internal Rotation) Level 3 (Green)     Shoulder Exercises: Prone   Extension Strengthening;Left;10 reps  5 lbs    Horizontal ABduction 1 Strengthening   Horizontal ABduction 1 Weight (lbs) 0   Horizontal ABduction 1 Limitations 1X10   Other Prone Exercises Rows 5# 1X10     Shoulder Exercises: ROM/Strengthening   Other ROM/Strengthening Exercises wand ER at 0 degrees abd and abducted to position of playing guitar.      Manual Therapy   Manual Therapy Joint mobilization   Manual therapy comments grade 3 destraction/inferior /posterior glides Lt GH joint                     PT Long Term Goals - 02/06/16 1049      PT LONG TERM GOAL #1   Title I with advanced HEP ( 02/25/16)    Time 6   Period Weeks   Status On-going     PT LONG TERM GOAL #2   Title demo painfree Lt shoulder motion - reaching for seatbelt, donning coat and reaching behind back ( 02/25/16)    Time 6   Period Weeks   Status On-going     PT LONG TERM GOAL #3   Title sleep per his previous level without Lt  shoulder waking him ( 02/25/16)    Time 6   Period Weeks   Status On-going     PT LONG TERM GOAL #4   Title demo Lt shoulder strength =/> 5-/5 without pain ( 02/25/16)    Time 6   Period Weeks   Status On-going     PT LONG TERM GOAL #5   Title improve FOTO =/< 31% limited ( 02/25/16)    Period Weeks   Status On-going               Plan - 02/06/16 1043    Clinical Impression Statement Pt reports that he has been improving, less pain yesterday and able to do more work without pain. Shoulder more irritated today after using the elliptical. Pt describes having trouble holding and playing his guitar for 20 minutes. Added ER stretch to HEP.    Rehab Potential Good   PT Frequency 1x / week   PT Duration 6 weeks   PT Treatment/Interventions Ultrasound;Therapeutic exercise;Dry needling;Taping;Vasopneumatic Device;Manual techniques;Cryotherapy;Electrical Stimulation;Patient/family education    PT Next Visit Plan Check ER stretches and if any change in playing guitar noted. Continue with scapuar/RTC strengthening.    Consulted and Agree with Plan of Care Patient      Patient will benefit from skilled therapeutic intervention in order to improve the following deficits and impairments:  Postural dysfunction, Decreased strength, Pain, Impaired UE functional use, Increased muscle spasms  Visit Diagnosis: Chronic left shoulder pain  Weakness generalized  Abnormal posture     Problem List Patient Active Problem List   Diagnosis Date Noted  . Rotator cuff tear 12/18/2015  . Varicose vein of leg 11/15/2014  . IFG (impaired fasting glucose) 04/25/2014  . Osteoporosis 11/21/2013  . Hiatal hernia 11/13/2013  . 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 BACK, LOWER, PAIN 12/23/2009    Linard Millers, PT, CSCS 02/06/2016, 11:06 AM  [facility] [place], [place], [address] Phone: [phone]   Fax:  [phone]  Name: Nicolas Boone MRN: [id number] Date of Birth: Nov 05, 1955   PHYSICAL THERAPY DISCHARGE SUMMARY  Visits from Start of Care: 4  Current functional level related to goals / functional outcomes: As noted above. Pt reported making gradual improvement but did not return for further PT sessions as anticipated. Will D/C from PT services.    Remaining deficits: As noted above   Education / Equipment: As noted above Plan: Patient agrees to discharge.  Patient goals were not met. Patient is being discharged due to not returning since the last visit.  ?????    Cassell Clement, PT, CSCS

## 2016-03-26 ENCOUNTER — Ambulatory Visit (INDEPENDENT_AMBULATORY_CARE_PROVIDER_SITE_OTHER): Payer: Commercial Managed Care - HMO | Admitting: Family Medicine

## 2016-03-26 ENCOUNTER — Encounter: Payer: Self-pay | Admitting: Family Medicine

## 2016-03-26 VITALS — BP 118/63 | HR 71 | Wt 178.0 lb

## 2016-03-26 DIAGNOSIS — M75112 Incomplete rotator cuff tear or rupture of left shoulder, not specified as traumatic: Secondary | ICD-10-CM

## 2016-03-26 MED ORDER — TRAMADOL HCL 50 MG PO TABS: 50.0000 mg | ORAL_TABLET | Freq: Three times a day (TID) | ORAL | 0 refills | Status: DC | PRN

## 2016-03-26 NOTE — Patient Instructions (Signed)
Thank you for coming in today. Use tramadol sparingly.  Follow up with Dr Pill.

## 2016-03-26 NOTE — Progress Notes (Signed)
Nicolas Boone is a 61 y.o. male who presents to [facility] today for shoulder pain.   Patient has been seen several times for chronic left shoulder pain. He eventually had an MRI which showed incomplete tear of the supraspinatus tendon along with significant tendinosis. He's very resistant to the idea of a steroid injection as he notes steroids have not helped his problems in the past. He had an extensive course of physical therapy and treatment with nitroglycerin patch protocol. He notes this is helped a little bit however he continues to experience significant pain. Is unable to do normal activities of daily living because of the pain or exercise. He is very frustrated and willing to consider surgery at this time.  Past Medical History:  Diagnosis Date  . Osteopenia   . Rotator cuff tear 12/18/2015   Past Surgical History:  Procedure Laterality Date  . APPENDECTOMY    . REFRACTIVE SURGERY    . VARICOSE VEIN SURGERY     Social History  Substance Use Topics  . Smoking status: Former Smoker    Types: Cigars  . Smokeless tobacco: Not on file  . Alcohol use Yes     ROS:  As above   Medications: Current Outpatient Prescriptions  Medication Sig Dispense Refill  . clotrimazole (LOTRIMIN AF) 1 % cream Apply 1 application topically 2 (two) times daily.    [name] EPINEPHrine (EPIPEN 2-PAK) 0.3 mg/0.3 mL IJ SOAJ injection Inject 0.3 mLs (0.3 mg total) into the muscle once. 2 Device 1  . naproxen (NAPROSYN) 500 MG tablet Take 1 tablet (500 mg total) by mouth at bedtime as needed for moderate pain. 30 tablet 2  . nitroGLYCERIN (NITRODUR - DOSED IN MG/24 HR) 0.2 mg/hr patch 1/4 patch to left shoulder daily for tendonitis 30 patch 1  . traMADol (ULTRAM) 50 MG tablet Take 1 tablet (50 mg total) by mouth every 8 (eight) hours as needed. 15 tablet 0   No current facility-administered medications for this visit.    Allergies  Allergen Reactions  .  Bee Venom   . Nutritional Supplements     Needs epi-pen rx     Exam:  BP 118/63   Pulse 71   Wt 178 lb (80.7 kg)   BMI 30.55 kg/m  General: Well Developed, well nourished, and in no acute distress.  Neuro/Psych: Alert and oriented x3, extra-ocular muscles intact, able to move all 4 extremities, sensation grossly intact. Skin: Warm and dry, no rashes noted.  Respiratory: Not using accessory muscles, speaking in full sentences, trachea midline.  Cardiovascular: Pulses palpable, no extremity edema. Abdomen: Does not appear distended. MSK: Left shoulder:  left shoulder:  Normal appearing without swelling or deformity Non-tender to palpation of shoulder and elbow Decreased range of motion secondary to pain with internal rotation. Full range of motion with shoulder flexion, external rotation, and abduction. Increased pain with reaching overhead Equal strength bilaterally Positive Empty Can test Positive Hawkins test,  Negative Spurling Negative Speeds and Yergason    No results found for this or any previous visit (from the past 48 hour(s)). No results found.    Assessment and Plan: 61 y.o. 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  We discussed options. Patient would like to avoid steroid injection if possible. Recommend discussion with orthopedic surgery. Refer to Dr. Corinna Capra.    Orders Placed This Encounter  Procedures  . Ambulatory referral to Orthopedic Surgery  Referral Priority:   Routine    Referral Type:   Surgical    Referral Reason:   Specialty Services Required    Referred to Provider:   Derrek GuStephen G Pill, MD    Requested Specialty:   Orthopedic Surgery    Number of Visits Requested:   1    Discussed warning signs or symptoms. Please see discharge instructions. Patient expresses understanding.

## 2016-03-29 ENCOUNTER — Telehealth: Payer: Self-pay

## 2016-03-29 NOTE — Telephone Encounter (Signed)
Pt called stating that he would like to be referred to Nicolas Boone per the recommendation of some friends. Please advise.

## 2016-10-26 ENCOUNTER — Other Ambulatory Visit: Payer: Self-pay | Admitting: Family Medicine

## 2016-10-26 DIAGNOSIS — Z0000 Encounter for general adult medical examination without abnormal findings: Secondary | ICD-10-CM

## 2016-10-26 DIAGNOSIS — R7301 Impaired fasting glucose: Secondary | ICD-10-CM

## 2016-10-26 NOTE — Progress Notes (Signed)
Pt's wife requested labs be entered so they can be completed prior to upcoming annual exam. Left VM advising labs have been entered.

## 2016-10-27 LAB — TSH: TSH: 1.06 m[IU]/L (ref 0.40–4.50)

## 2016-10-28 ENCOUNTER — Encounter: Payer: Self-pay | Admitting: Family Medicine

## 2016-10-28 ENCOUNTER — Ambulatory Visit (INDEPENDENT_AMBULATORY_CARE_PROVIDER_SITE_OTHER): Payer: Commercial Managed Care - HMO

## 2016-10-28 ENCOUNTER — Ambulatory Visit (INDEPENDENT_AMBULATORY_CARE_PROVIDER_SITE_OTHER): Payer: Commercial Managed Care - HMO | Admitting: Family Medicine

## 2016-10-28 VITALS — BP 116/69 | HR 72 | Ht 64.25 in | Wt 167.0 lb

## 2016-10-28 DIAGNOSIS — M25511 Pain in right shoulder: Secondary | ICD-10-CM

## 2016-10-28 DIAGNOSIS — H6123 Impacted cerumen, bilateral: Secondary | ICD-10-CM | POA: Diagnosis not present

## 2016-10-28 DIAGNOSIS — Z0000 Encounter for general adult medical examination without abnormal findings: Secondary | ICD-10-CM | POA: Diagnosis not present

## 2016-10-28 DIAGNOSIS — G8929 Other chronic pain: Secondary | ICD-10-CM | POA: Diagnosis not present

## 2016-10-28 DIAGNOSIS — M25611 Stiffness of right shoulder, not elsewhere classified: Secondary | ICD-10-CM | POA: Diagnosis not present

## 2016-10-28 LAB — PSA: PSA: 1.8 ng/mL (ref <=4.0)

## 2016-10-28 LAB — BASIC METABOLIC PANEL WITH GFR
BUN: 15 mg/dL (ref 7–25)
CO2: 21 mmol/L (ref 20–32)
Calcium: 9.4 mg/dL (ref 8.6–10.3)
Chloride: 107 mmol/L (ref 98–110)
Creat: 0.76 mg/dL (ref 0.70–1.33)
GFR, Est African American: >89 mL/min (ref >=60)
GFR, Est Non African American: >89 mL/min (ref >=60)
Glucose, Bld: 95 mg/dL (ref 65–99)
POTASSIUM: 4.5 mmol/L (ref 3.5–5.3)
Sodium: 139 mmol/L (ref 135–146)

## 2016-10-28 LAB — LIPID PANEL
CHOL/HDL RATIO: 3.3 ratio (ref <5.0)
Cholesterol: 179 mg/dL (ref <200)
HDL: 55 mg/dL (ref >40)
LDL CALC: 115 mg/dL — AB (ref <100)
Triglycerides: 47 mg/dL (ref <150)
VLDL: 9 mg/dL (ref <30)

## 2016-10-28 LAB — HEMOGLOBIN A1C
Hgb A1c MFr Bld: 5.4 % (ref <5.7)
Mean Plasma Glucose: 108 mg/dL

## 2016-10-28 NOTE — Progress Notes (Signed)
Subjective:    Patient ID: Nicolas Boone, male    DOB: 1955-06-06, 61 y.o.   MRN: [id number]  HPI 61 year old male comes in today for complete physical exam.  Had colonoscopy performed about 3 years ago so call to get that report. He did go for blood work yesterday sober over those results today. Been running and walking daily for exercise.  He also complains of right shoulder pain. He said about a year ago he was having a lot of difficulty with his left shoulder was eventually diagnosed with a rotator cuff tear. A repair was recommended at the time but he held off and says eventually got enough better that he decided to live with it. But now over the last 6 months his right shoulder has been painful he's not able to locate where exactly the pain is concentrated. Sometimes he says it's more lateral and sometimes it's more anterior. He says it's definitely worse when he lays on it at night and sometimes even small motions like picking up a coffee cup he'll feel like it's week. He says also he plays guitar for fine and after about 20 minutes he has to stop because his shoulder is hurting. He denies any known injury or trauma to the area. He says the pain is very similar to what he was experiencing in his left shoulder about a year ago.  He also wants me to look in his ears today. He does feel like his ear are impacted bilaterally with cerumen. He's had this happen recurrently in the past.  He says he got his last tetanus shot in 2015 at work.  Review of Systems  Comprehensive of review of systems is negative.   BP 116/69   Pulse 72   Ht 5' 4.25" (1.632 m)   Wt 167 lb (75.8 kg)   SpO2 99%   BMI 28.44 kg/m     Allergies  Allergen Reactions  . Bee Venom   . Nutritional Supplements     Needs epi-pen rx    Past Medical History:  Diagnosis Date  . Osteopenia   . Rotator cuff tear 12/18/2015    Past Surgical History:  Procedure Laterality Date  . APPENDECTOMY    . REFRACTIVE  SURGERY    . VARICOSE VEIN SURGERY      Social History   Social History  . Marital status: Single    Spouse name: Montey Hora  . Number of children: 1  . Years of education: N/A   Occupational History  . Associate Professor Tobacco   Social History Main Topics  . Smoking status: Former Smoker    Types: Cigars  . Smokeless tobacco: Never Used  . Alcohol use Yes  . Drug use: No  . Sexual activity: Yes    Partners: Female   Other Topics Concern  . 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  He is married to [place] with 1 child.     Family History  Problem Relation Age of Onset  . Throat cancer Father   . Kidney cancer Brother   . Ovarian cancer Sister   . Diabetes Mellitus II Brother   . Diabetes Mellitus II Sister     Outpatient Encounter Prescriptions as of 10/28/2016  Medication Sig  . naproxen (NAPROSYN) 500 MG tablet Take 1 tablet (500 mg total) by mouth at bedtime as needed for moderate pain.  . traMADol (ULTRAM) 50 MG tablet Take  1 tablet (50 mg total) by mouth every 8 (eight) hours as needed.  . [DISCONTINUED] clotrimazole (LOTRIMIN AF) 1 % cream Apply 1 application topically 2 (two) times daily.  . [DISCONTINUED] EPINEPHrine (EPIPEN 2-PAK) 0.3 mg/0.3 mL IJ SOAJ injection Inject 0.3 mLs (0.3 mg total) into the muscle once.  . [DISCONTINUED] nitroGLYCERIN (NITRODUR - DOSED IN MG/24 HR) 0.2 mg/hr patch 1/4 patch to left shoulder daily for tendonitis   No facility-administered encounter medications on file as of 10/28/2016.          Objective:   Physical Exam  Constitutional: He is oriented to person, place, and time. He appears well-developed and well-nourished.  HENT:  Head: Normocephalic and atraumatic.  Right Ear: External ear normal.  Left Ear: External ear normal.  Nose: Nose normal.  Mouth/Throat: Oropharynx is clear and moist.  Eyes: Pupils are equal, round, and reactive to light. Conjunctivae and EOM are  normal.  Neck: Normal range of motion. Neck supple. No thyromegaly present.  Cardiovascular: Normal rate, regular rhythm, normal heart sounds and intact distal pulses.   Pulmonary/Chest: Effort normal and breath sounds normal.  Abdominal: Soft. Bowel sounds are normal. He exhibits no distension and no mass. There is no tenderness. There is no rebound and no guarding.  Musculoskeletal: Normal range of motion.  Lymphadenopathy:    He has no cervical adenopathy.  Neurological: He is alert and oriented to person, place, and time. He has normal reflexes.  Skin: Skin is warm and dry.  Psychiatric: He has a normal mood and affect. His behavior is normal. Judgment and thought content normal.          Assessment & Plan:  CPE Keep up a regular exercise program and make sure you are eating a healthy diet Try to eat 4 servings of dairy a day, or if you are lactose intolerant take a calcium with vitamin D daily.  Your vaccines are up to date.   Did remind him to get a yearly eye exam. Tetanus updated in the system.  Right shoulder pain-possible shoulder bursitis versus rotator cuff tear they mild. Recommend home physical therapy for the next 3 weeks. Handout provided with stretches and exercises. If not improving them please let me know. Okay to use naproxen. If not improving will refer to sports medicine for further evaluation. We'll go ahead and get x-ray since pain has been present for about 6 months.  Bilateral cerumen impaction - patient had been trying to remove the wax with earwax softening drops at home but not successful. He feels like his ears are clogged. Irrigation recommended and performed. Patient tolerated well.  Indication: Cerumen impaction of the ear(s)  Indication: Cerumen impaction of the ear(s) Medical necessity statement: On physical examination, cerumen impairs clinically significant portions of the external auditory canal, and tympanic membrane. Noted obstructive, copious  cerumen that cannot be removed without irrigation.  Consent: Discussed benefits and risks of procedure and verbal consent obtained Procedure: Patient was prepped for the procedure. Utilized an otoscope to assess and take note of the ear canal, the tympanic membrane, and the presence, amount, and placement of the cerumen. Gentle water irrigation utilized to remove cerumen.  Post procedure examination: shows cerumen was completely removed. Patient tolerated procedure well. The patient is made aware that they may experience temporary vertigo, temporary hearing loss, and temporary discomfort. If these symptom last for more than 24 hours to call the clinic or proceed to the ED.

## 2016-10-28 NOTE — Patient Instructions (Addendum)

## 2017-10-28 ENCOUNTER — Telehealth: Payer: Self-pay

## 2017-10-28 DIAGNOSIS — Z0000 Encounter for general adult medical examination without abnormal findings: Secondary | ICD-10-CM

## 2017-10-28 NOTE — Telephone Encounter (Signed)
Requested labs be done prior to appt on 11-01-17  Labs pended, please review and sign if appropriate  Thanks!!

## 2017-10-31 ENCOUNTER — Other Ambulatory Visit: Payer: Self-pay | Admitting: *Deleted

## 2017-10-31 NOTE — Telephone Encounter (Signed)
Orders signed.Loralee PacasBarkley, Aishani Kalis KirkwoodLynetta

## 2017-11-01 ENCOUNTER — Encounter: Payer: Self-pay | Admitting: Family Medicine

## 2017-11-01 ENCOUNTER — Ambulatory Visit (INDEPENDENT_AMBULATORY_CARE_PROVIDER_SITE_OTHER): Payer: 59 | Admitting: Family Medicine

## 2017-11-01 VITALS — BP 92/52 | HR 86 | Ht 64.0 in | Wt 160.0 lb

## 2017-11-01 DIAGNOSIS — Z0000 Encounter for general adult medical examination without abnormal findings: Secondary | ICD-10-CM

## 2017-11-01 DIAGNOSIS — R252 Cramp and spasm: Secondary | ICD-10-CM

## 2017-11-01 NOTE — Patient Instructions (Signed)

## 2017-11-01 NOTE — Progress Notes (Signed)
Subjective:    Patient ID: Nicolas Boone, male    DOB: 1956-01-25, 62 y.o.   MRN: [id number]  HPI 62 year old male is here today for complete physical exam.  She doing really well overall.  He and his wife just finished building their new home.  Was expensing a lot of leg cramps and was daily around that time but for the last month they actually seem to have gotten better as he is completed the house.   Review of Systems [name] of review of systems is negative except for HPI.  BP (!) 92/52   Pulse 86   Ht 5\' 4"  (1.626 m)   Wt 160 lb (72.6 kg)   SpO2 97%   BMI 27.46 kg/m     Allergies  Allergen Reactions  . Bee Venom   . Nutritional Supplements     Needs epi-pen rx    Past Medical History:  Diagnosis Date  . Osteopenia   . Rotator cuff tear 12/18/2015    Past Surgical History:  Procedure Laterality Date  . APPENDECTOMY    . REFRACTIVE SURGERY    . VARICOSE VEIN SURGERY      Social History   Socioeconomic History  . Marital status: Single    Spouse name: Nicolas Boone  . Number of children: 1  . Years of education: Not on file  . Highest education level: Not on file  Occupational History  . Occupation: Sports administratormechanic    Employer: [employer]  Social Needs  . Financial resource strain: Not on file  . Food insecurity:    Worry: Not on file    Inability: Not on file  . Transportation needs:    Medical: Not on file    Non-medical: Not on file  Tobacco Use  . Smoking status: Former Smoker    Types: Cigars  . Smokeless tobacco: Never Used  Substance and Sexual Activity  . Alcohol use: Yes  . Drug use: No  . Sexual activity: Yes    Partners: Female  Lifestyle  . Physical activity:    Days per week: Not on file    Minutes per session: Not on file  . Stress: Not on file  Relationships  . Social connections:    Talks on phone: Not on file    Gets together: Not on file    Attends religious service: Not on file    Active member of club or organization: Not  on file    Attends meetings of clubs or organizations: Not on file    Relationship status: Not on file  . Intimate partner violence:    Fear of current or ex partner: Not on file    Emotionally abused: Not on file    Physically abused: Not on file    Forced sexual activity: Not on file  Other Topics Concern  . 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  He is married to [place] with 1 child.     Family History  Problem Relation Age of Onset  . Throat cancer Father   . Kidney cancer Brother   . Ovarian cancer Sister   . Diabetes Mellitus II Brother   . Diabetes Mellitus II Sister     Outpatient Encounter Medications as of 11/01/2017  Medication Sig  . [DISCONTINUED] naproxen (NAPROSYN) 500 MG tablet Take 1 tablet (500 mg total) by mouth at bedtime as needed for moderate pain.  . [DISCONTINUED] traMADol (ULTRAM) 50  MG tablet Take 1 tablet (50 mg total) by mouth every 8 (eight) hours as needed.   No facility-administered encounter medications on file as of 11/01/2017.          Objective:   Physical Exam  Constitutional: He is oriented to person, place, and time. He appears well-developed and well-nourished.  HENT:  Head: Normocephalic and atraumatic.  Right Ear: External ear normal.  Left Ear: External ear normal.  Nose: Nose normal.  Mouth/Throat: Oropharynx is clear and moist.  Eyes: Pupils are equal, round, and reactive to light. Conjunctivae and EOM are normal.  Neck: Normal range of motion. Neck supple. No thyromegaly present.  Cardiovascular: Normal rate, regular rhythm, normal heart sounds and intact distal pulses.  Pulmonary/Chest: Effort normal and breath sounds normal.  Abdominal: Soft. Bowel sounds are normal. He exhibits no distension and no mass. There is no tenderness. There is no rebound and no guarding.  Musculoskeletal: Normal range of motion.  Lymphadenopathy:    He has no cervical adenopathy.  Neurological:  He is alert and oriented to person, place, and time. He has normal reflexes.  Skin: Skin is warm and dry.  Psychiatric: He has a normal mood and affect. His behavior is normal. Judgment and thought content normal.        Assessment & Plan:  CPE  Keep up a regular exercise program and make sure you are eating a healthy diet Try to eat 4 servings of dairy a day, or if you are lactose intolerant take a calcium with vitamin D daily.  Your vaccines are up to date.   Cramps-right now they have been better for the last month but if they start recurring then please let me know and we can do some additional work-up.  He can also try some over-the-counter magnesium if he would like.  Potassium level was normal that he just had drawn yesterday.

## 2017-11-02 LAB — COMPLETE METABOLIC PANEL WITH GFR
AG Ratio: 1.9 (calc) (ref 1.0–2.5)
ALBUMIN MSPROF: 4.1 g/dL (ref 3.6–5.1)
ALT: 15 U/L (ref 9–46)
AST: 17 U/L (ref 10–35)
Alkaline phosphatase (APISO): 64 U/L (ref 40–115)
BUN: 17 mg/dL (ref 7–25)
CALCIUM: 9.4 mg/dL (ref 8.6–10.3)
CO2: 27 mmol/L (ref 20–32)
CREATININE: 0.76 mg/dL (ref 0.70–1.25)
Chloride: 105 mmol/L (ref 98–110)
GFR, EST AFRICAN AMERICAN: 115 mL/min/{1.73_m2} (ref >=60)
GFR, Est Non African American: 99 mL/min/{1.73_m2} (ref >=60)
GLUCOSE: 98 mg/dL (ref 65–99)
Globulin: 2.2 g/dL (calc) (ref 1.9–3.7)
Potassium: 4.6 mmol/L (ref 3.5–5.3)
Sodium: 139 mmol/L (ref 135–146)
TOTAL PROTEIN: 6.3 g/dL (ref 6.1–8.1)
Total Bilirubin: 0.7 mg/dL (ref 0.2–1.2)

## 2017-11-02 LAB — LIPID PANEL
CHOL/HDL RATIO: 3.2 (calc) (ref <5.0)
CHOLESTEROL: 181 mg/dL (ref <200)
HDL: 56 mg/dL (ref >40)
LDL CHOLESTEROL (CALC): 110 mg/dL — AB
Non-HDL Cholesterol (Calc): 125 mg/dL (calc) (ref <130)
Triglycerides: 61 mg/dL (ref <150)

## 2017-11-02 LAB — CBC
HEMATOCRIT: 43.5 % (ref 38.5–50.0)
HEMOGLOBIN: 14.9 g/dL (ref 13.2–17.1)
MCH: 31.2 pg (ref 27.0–33.0)
MCHC: 34.3 g/dL (ref 32.0–36.0)
MCV: 91.2 fL (ref 80.0–100.0)
MPV: 11.8 fL (ref 7.5–12.5)
Platelets: 206 10*3/uL (ref 140–400)
RBC: 4.77 10*6/uL (ref 4.20–5.80)
RDW: 13.1 % (ref 11.0–15.0)
WBC: 4.2 10*3/uL (ref 3.8–10.8)

## 2017-11-02 LAB — HEMOGLOBIN A1C W/OUT EAG: HEMOGLOBIN A1C: 5.5 %{Hb} (ref <5.7)

## 2017-11-02 LAB — PSA: PSA: 1.9 ng/mL (ref <=4.0)

## 2017-11-02 LAB — TSH: TSH: 1.16 mIU/L (ref 0.40–4.50)

## 2018-03-28 IMAGING — MR MR SHOULDER*L* W/O CM
5 series · 40 of 40 positions shown · non-contrast
Comparison: None.

CLINICAL DATA: Left shoulder pain.  Lifting injury August 2014.

EXAM:
MRI OF THE LEFT SHOULDER WITHOUT CONTRAST
TECHNIQUE: Multiplanar, multisequence MR imaging of the shoulder was performed.
No intravenous contrast was administered.

[Series 3: T2 fat-sat · axial · 4.0mm · 0.55mm/px · z∈[-24,+60]mm · 8 of 20 slices shown (1 of 3)]
[im 1/20]
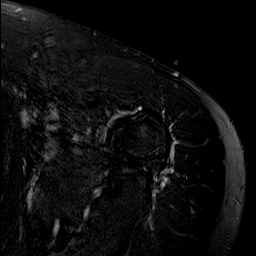
[im 3/20]
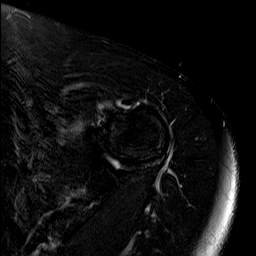
[im 6/20]
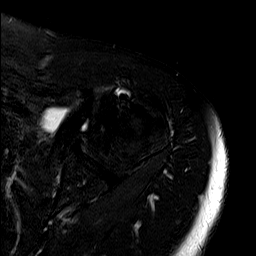
[im 9/20]
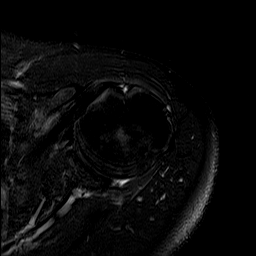
[im 11/20]
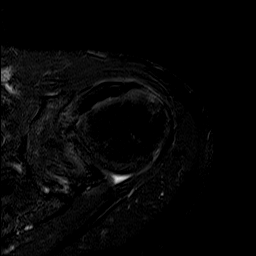
[im 14/20]
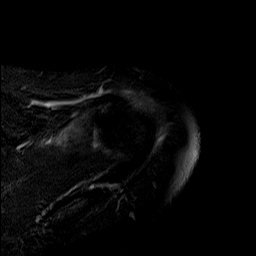
[im 17/20]
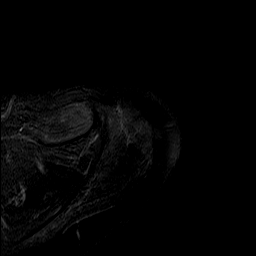
[im 20/20]
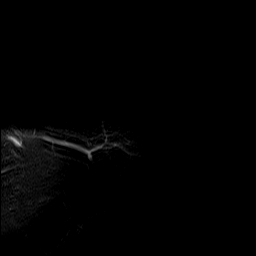

[Series 4: T2 fat-sat · oblique · 4.0mm · 0.55mm/px · 8 of 18 slices shown (2 of 3)]
[im 1/18]
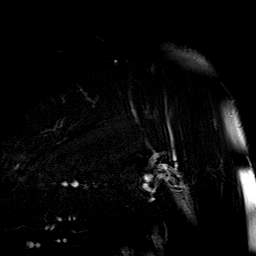
[im 3/18]
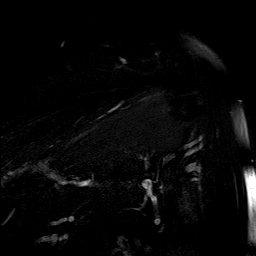
[im 5/18]
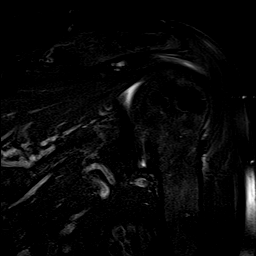
[im 8/18]
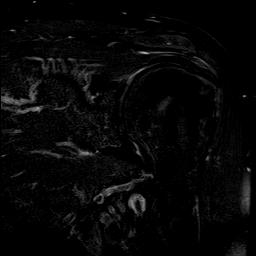
[im 10/18]
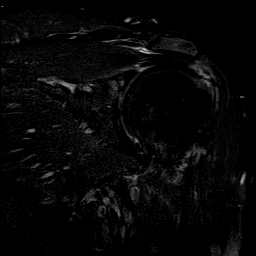
[im 13/18]
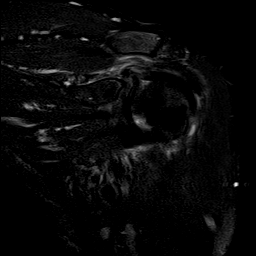
[im 15/18]
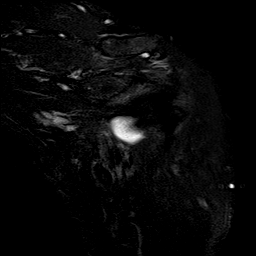
[im 18/18]
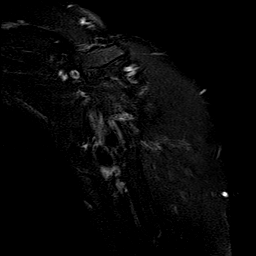

[Series 5: PD · oblique · 4.0mm · 0.55mm/px · 8 of 18 slices shown]
[im 1/18]
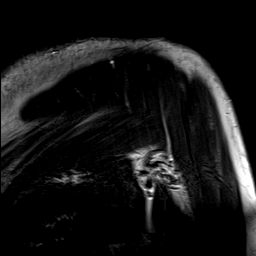
[im 3/18]
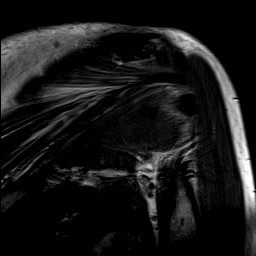
[im 5/18]
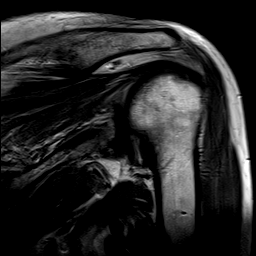
[im 8/18]
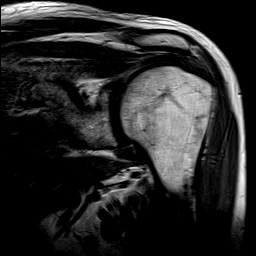
[im 10/18]
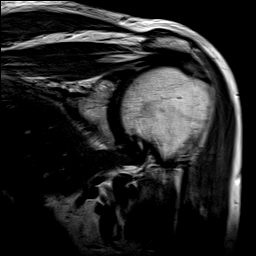
[im 13/18]
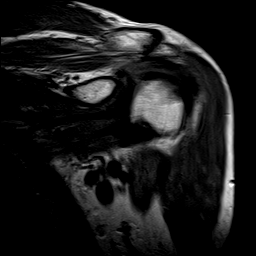
[im 15/18]
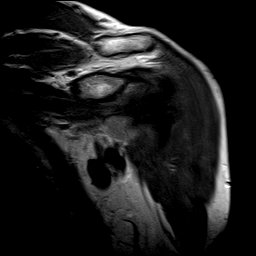
[im 18/18]
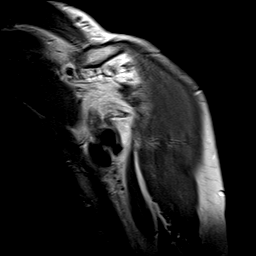

[Series 6: T1 · oblique · 4.0mm · 0.55mm/px · 8 of 20 slices shown]
[im 1/20]
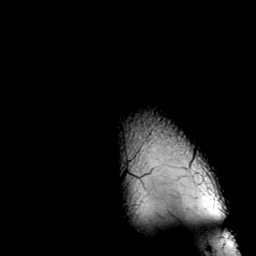
[im 3/20]
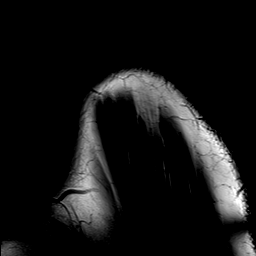
[im 6/20]
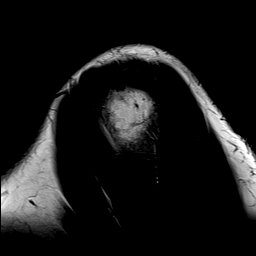
[im 9/20]
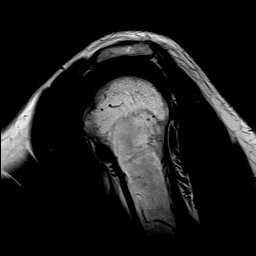
[im 11/20]
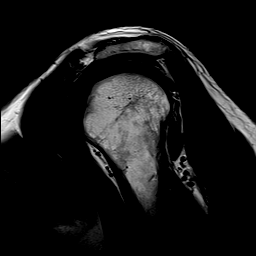
[im 14/20]
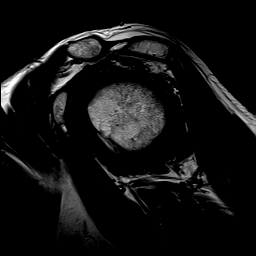
[im 17/20]
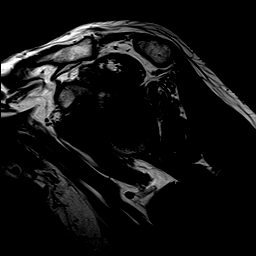
[im 20/20]
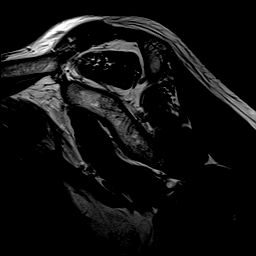

[Series 7: T2 fat-sat · oblique · 4.0mm · 0.55mm/px · 8 of 20 slices shown (3 of 3)]
[im 1/20]
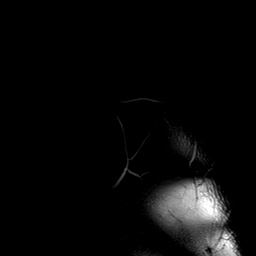
[im 3/20]
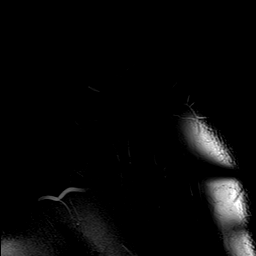
[im 6/20]
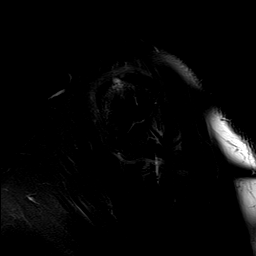
[im 9/20]
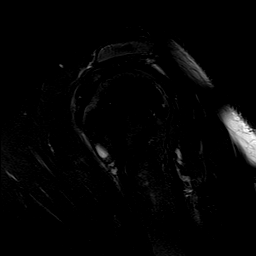
[im 11/20]
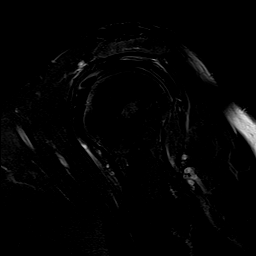
[im 14/20]
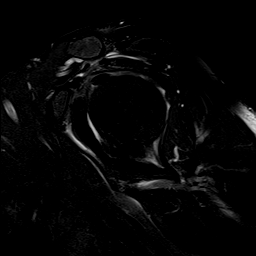
[im 17/20]
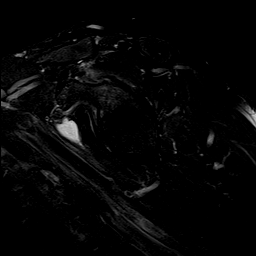
[im 20/20]
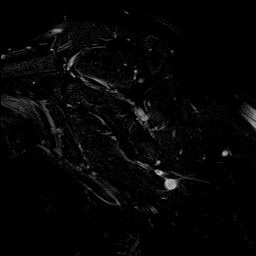

[40 of 40 positions shown; findings below may reference images not displayed]

FINDINGS: Rotator cuff: Focal severe tendinosis of the supraspinatus tendon
with a high-grade partial-thickness, near full-thickness, bursal
surface tear measuring 6 mm in anterior- posterior dimension.
Infraspinatus tendon is intact. Teres minor tendon is intact.
Subscapularis tendon is intact.

Muscles: No atrophy or fatty replacement of nor abnormal signal
within, the muscles of the rotator cuff.

Biceps long head:  Intact.

Acromioclavicular Joint: Normal acromioclavicular joint. Type II
acromion. Small amount of subacromial/subdeltoid bursal fluid.

Glenohumeral Joint: No joint effusion. Partial-thickness cartilage
loss of the superior glenoid.

Labrum: Grossly intact, but evaluation is limited by lack of
intraarticular fluid and patient motion.

Bones: Mild subcortical reactive marrow changes at the supraspinatus
insertion. No other focal marrow signal abnormality. No fracture or
dislocation.

Other: No fluid collection or hematoma.
IMPRESSION: 1. Focal severe tendinosis of the supraspinatus tendon with a
high-grade partial-thickness, near full-thickness, bursal surface
tear measuring 6 mm in anterior- posterior dimension.
2. Mild subacromial/subdeltoid bursitis.

## 2018-03-28 IMAGING — DX DG ORBITS FOR FOREIGN BODY
2 series · 2 of 2 positions shown · non-contrast
Comparison: None.

CLINICAL DATA: Metal working/exposure; clearance prior to MRI

EXAM:
ORBITS FOR FOREIGN BODY - 2 VIEW

[orbits waters (1 of 2)]
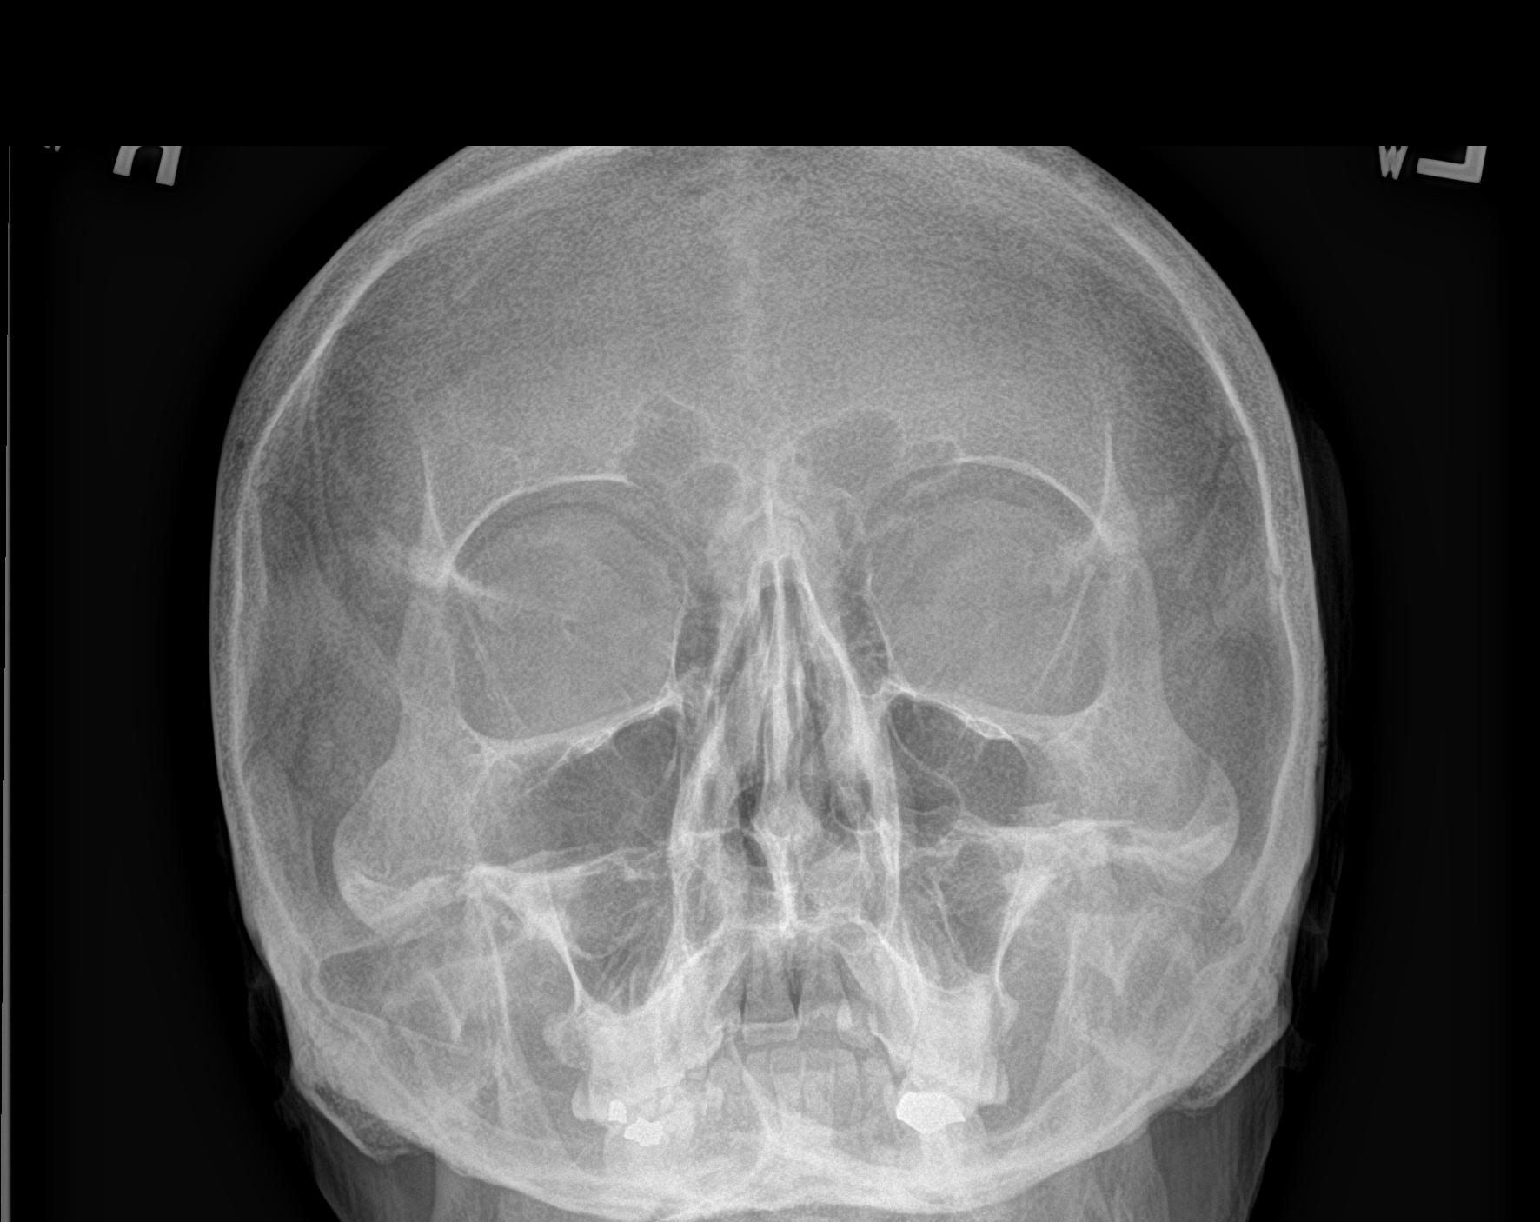

[orbits waters (2 of 2)]
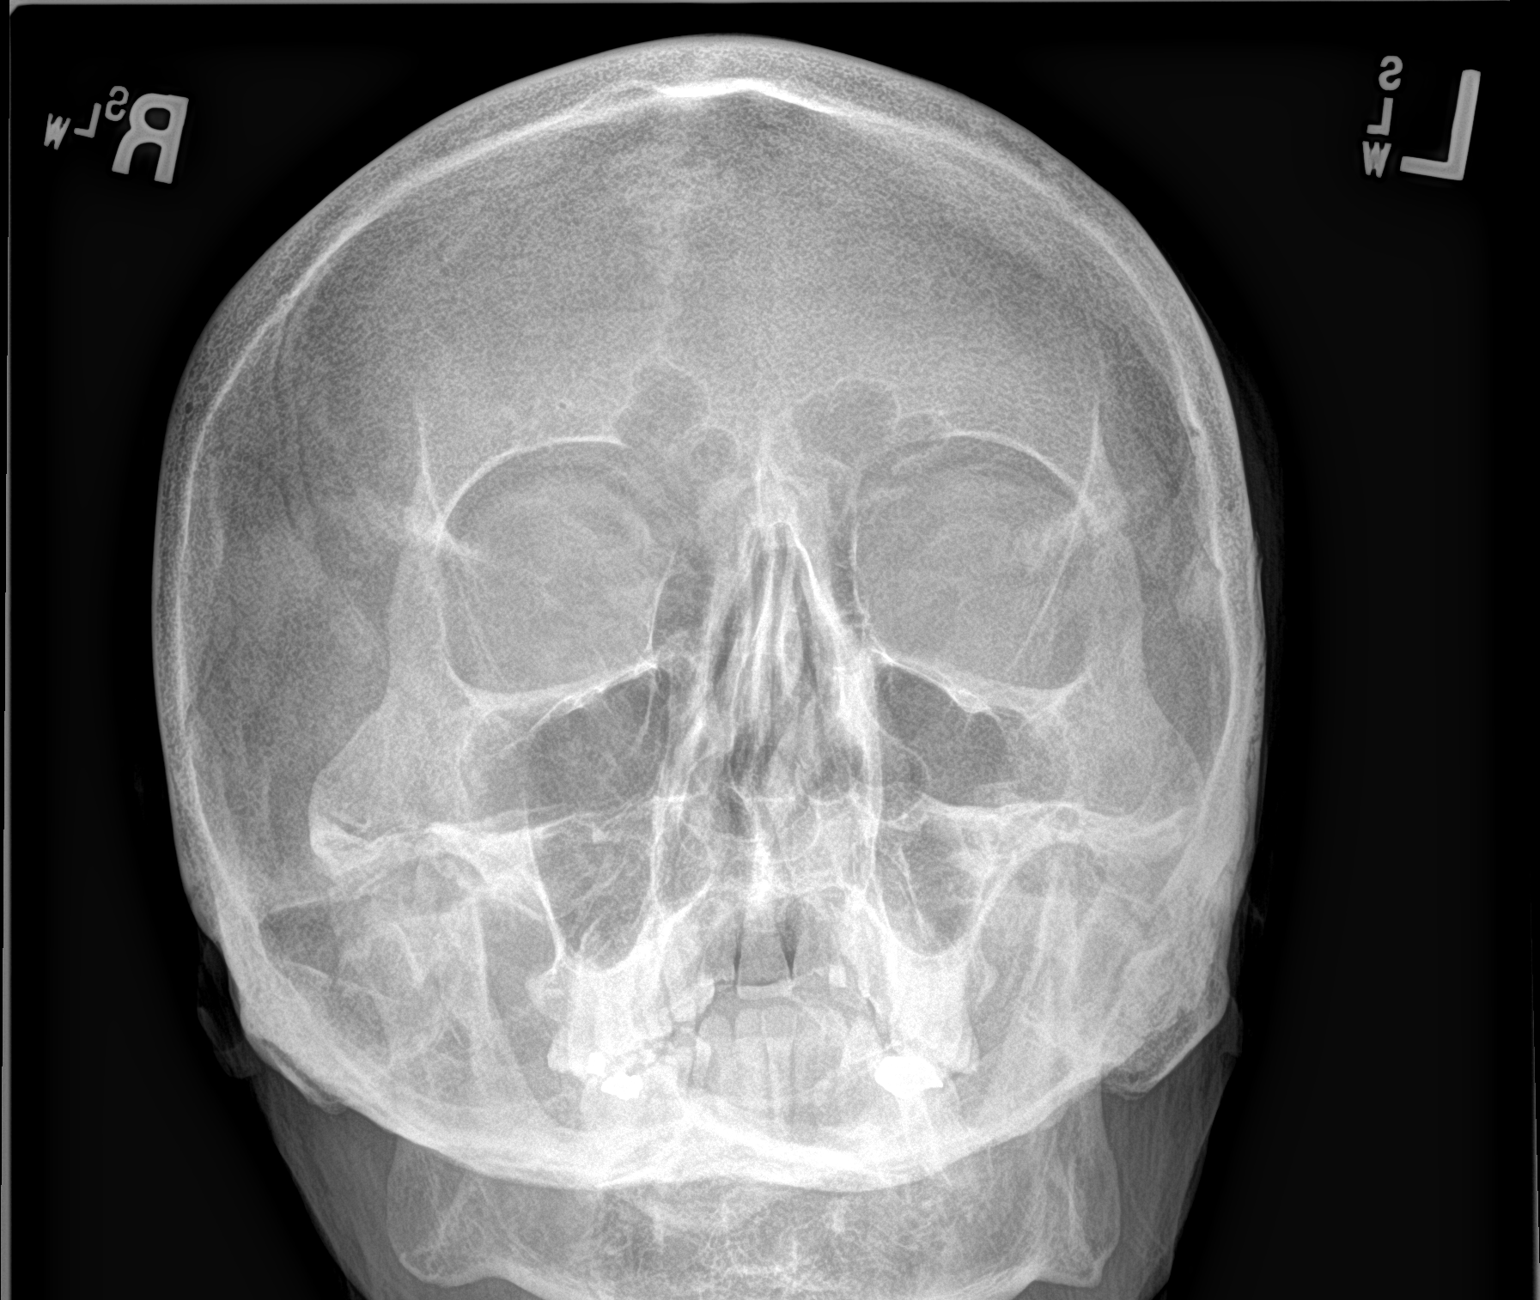

[2 of 2 positions shown; findings below may reference images not displayed]

FINDINGS: There is no evidence of metallic foreign body within the orbits. No
significant bone abnormality identified.
IMPRESSION: No evidence of metallic foreign body within the orbits.

## 2018-11-02 ENCOUNTER — Telehealth: Payer: Self-pay

## 2018-11-02 ENCOUNTER — Other Ambulatory Visit: Payer: Self-pay | Admitting: *Deleted

## 2018-11-02 ENCOUNTER — Encounter: Payer: Self-pay | Admitting: Family Medicine

## 2018-11-02 ENCOUNTER — Ambulatory Visit (INDEPENDENT_AMBULATORY_CARE_PROVIDER_SITE_OTHER): Payer: 59 | Admitting: Family Medicine

## 2018-11-02 ENCOUNTER — Other Ambulatory Visit: Payer: Self-pay

## 2018-11-02 VITALS — BP 112/61 | HR 67 | Ht 64.0 in | Wt 162.0 lb

## 2018-11-02 DIAGNOSIS — Z0000 Encounter for general adult medical examination without abnormal findings: Secondary | ICD-10-CM

## 2018-11-02 DIAGNOSIS — R21 Rash and other nonspecific skin eruption: Secondary | ICD-10-CM | POA: Diagnosis not present

## 2018-11-02 DIAGNOSIS — Z125 Encounter for screening for malignant neoplasm of prostate: Secondary | ICD-10-CM

## 2018-11-02 DIAGNOSIS — E7800 Pure hypercholesterolemia, unspecified: Secondary | ICD-10-CM

## 2018-11-02 NOTE — Telephone Encounter (Signed)
Called to inform pt that labs have been faxed. lvm informing her of this.Nicolas Boone, Nicolas Boone, CMA

## 2018-11-02 NOTE — Patient Instructions (Signed)
Preventive Care 40-62 Years Old, Male Preventive care refers to lifestyle choices and visits with your health care provider that can promote health and wellness. This includes:  A yearly physical exam. This is also called an annual well check.  Regular dental and eye exams.  Immunizations.  Screening for certain conditions.  Healthy lifestyle choices, such as eating a healthy diet, getting regular exercise, not using drugs or products that contain nicotine and tobacco, and limiting alcohol use. What can I expect for my preventive care visit? Physical exam Your health care provider will check:  Height and weight. These may be used to calculate body mass index (BMI), which is a measurement that tells if you are at a healthy weight.  Heart rate and blood pressure.  Your skin for abnormal spots. Counseling Your health care provider may ask you questions about:  Alcohol, tobacco, and drug use.  Emotional well-being.  Home and relationship well-being.  Sexual activity.  Eating habits.  Work and work environment. What immunizations do I need?  Influenza (flu) vaccine  This is recommended every year. Tetanus, diphtheria, and pertussis (Tdap) vaccine  You may need a Td booster every 10 years. Varicella (chickenpox) vaccine  You may need this vaccine if you have not already been vaccinated. Zoster (shingles) vaccine  You may need this after age 60. Measles, mumps, and rubella (MMR) vaccine  You may need at least one dose of MMR if you were born in 1957 or later. You may also need a second dose. Pneumococcal conjugate (PCV13) vaccine  You may need this if you have certain conditions and were not previously vaccinated. Pneumococcal polysaccharide (PPSV23) vaccine  You may need one or two doses if you smoke cigarettes or if you have certain conditions. Meningococcal conjugate (MenACWY) vaccine  You may need this if you have certain conditions. Hepatitis A vaccine   You may need this if you have certain conditions or if you travel or work in places where you may be exposed to hepatitis A. Hepatitis B vaccine  You may need this if you have certain conditions or if you travel or work in places where you may be exposed to hepatitis B. Haemophilus influenzae type b (Hib) vaccine  You may need this if you have certain risk factors. Human papillomavirus (HPV) vaccine  If recommended by your health care provider, you may need three doses over 6 months. You may receive vaccines as individual doses or as more than one vaccine together in one shot (combination vaccines). Talk with your health care provider about the risks and benefits of combination vaccines. What tests do I need? Blood tests  Lipid and cholesterol levels. These may be checked every 5 years, or more frequently if you are over 50 years old.  Hepatitis C test.  Hepatitis B test. Screening  Lung cancer screening. You may have this screening every year starting at age 55 if you have a 30-pack-year history of smoking and currently smoke or have quit within the past 15 years.  Prostate cancer screening. Recommendations will vary depending on your family history and other risks.  Colorectal cancer screening. All adults should have this screening starting at age 50 and continuing until age 75. Your health care provider may recommend screening at age 45 if you are at increased risk. You will have tests every 1-10 years, depending on your results and the type of screening test.  Diabetes screening. This is done by checking your blood sugar (glucose) after you have not eaten   for a while (fasting). You may have this done every 1-3 years.  Sexually transmitted disease (STD) testing. Follow these instructions at home: Eating and drinking  Eat a diet that includes fresh fruits and vegetables, whole grains, lean protein, and low-fat dairy products.  Take vitamin and mineral supplements as recommended  by your health care provider.  Do not drink alcohol if your health care provider tells you not to drink.  If you drink alcohol: ? Limit how much you have to 0-2 drinks a day. ? Be aware of how much alcohol is in your drink. In the [place] one drink equals one 12 oz bottle of beer (355 mL), one 5 oz glass of wine (148 mL), or one 1 oz glass of hard liquor (44 mL). Lifestyle  Take daily care of your teeth and gums.  Stay active. Exercise for at least 30 minutes on 5 or more days each week.  Do not use any products that contain nicotine or tobacco, such as cigarettes, e-cigarettes, and chewing tobacco. If you need help quitting, ask your health care provider.  If you are sexually active, practice safe sex. Use a condom or other form of protection to prevent STIs (sexually transmitted infections).  Talk with your health care provider about taking a low-dose aspirin every day starting at age 33. What's next?  Go to your health care provider once a year for a well check visit.  Ask your health care provider how often you should have your eyes and teeth checked.  Stay up to date on all vaccines. This information is not intended to replace advice given to you by your health care provider. Make sure you discuss any questions you have with your health care provider. Document Released: 04/04/2015 Document Revised: 03/02/2018 Document Reviewed: 03/02/2018 Elsevier Patient Education  2020 [employer].

## 2018-11-02 NOTE — Addendum Note (Signed)
Addended by: Teddy Spike on: 11/02/2018 12:11 PM   Modules accepted: Orders

## 2018-11-02 NOTE — Telephone Encounter (Signed)
Nicolas Boone's wife called and would like CPE labs ordered. Labs have been ordered. Did you want other labs added?

## 2018-11-02 NOTE — Progress Notes (Addendum)
Established Patient Office Visit  Subjective:  Patient ID: Nicolas Boone, male    DOB: 1955/08/15  Age: 63 y.o. MRN: [id number]  CC:  Chief Complaint  Patient presents with  . Annual Exam    HPI Nicolas OppenheimMichael L Boone presents for CPE  He c/o his L ankle itching. He has been using hydrocortisone and this hasn't really helped. He runs about every other day for exercise.   Notes has diarrhea when he eats certain foods like Pizza. Has been going on for awhile.  He declines any additional w/u at this time.  Last colonoscopy was 03/09/19.   Past Medical History:  Diagnosis Date  . Osteopenia   . Rotator cuff tear 12/18/2015    Past Surgical History:  Procedure Laterality Date  . APPENDECTOMY    . REFRACTIVE SURGERY    . VARICOSE VEIN SURGERY      Family History  Problem Relation Age of Onset  . Throat cancer Father   . Kidney cancer Brother   . Ovarian cancer Sister   . Diabetes Mellitus II Brother   . Diabetes Mellitus II Sister     Social History   Socioeconomic History  . Marital status: Single    Spouse name: Montey Horandra  . Number of children: 1  . Years of education: Not on file  . Highest education level: Not on file  Occupational History  . Occupation: Sports administratormechanic    Employer: [employer]  Social Needs  . Financial resource strain: Not on file  . Food insecurity    Worry: Not on file    Inability: Not on file  . Transportation needs    Medical: Not on file    Non-medical: Not on file  Tobacco Use  . Smoking status: Former Smoker    Types: Cigars  . Smokeless tobacco: Never Used  Substance and Sexual Activity  . Alcohol use: Yes  . Drug use: No  . Sexual activity: Yes    Partners: Female  Lifestyle  . Physical activity    Days per week: Not on file    Minutes per session: Not on file  . Stress: Not on file  Relationships  . Social Musicianconnections    Talks on phone: Not on file    Gets together: Not on file    Attends religious service: Not on file     Active member of club or organization: Not on file    Attends meetings of clubs or organizations: Not on file    Relationship status: Not on file  . Intimate partner violence    Fear of current or ex partner: Not on file    Emotionally abused: Not on file    Physically abused: Not on file    Forced sexual activity: Not on file  Other Topics Concern  . 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  He is married to [place] with 1 child.     No outpatient medications prior to visit.   No facility-administered medications prior to visit.     Allergies  Allergen Reactions  . Bee Venom   . Nutritional Supplements     Needs epi-pen rx    ROS Review of Systems    Objective:    Physical Exam  Constitutional: He is oriented to person, place, and time. He appears well-developed and well-nourished.  HENT:  Head: Normocephalic and atraumatic.  Right Ear: External ear normal.  Left Ear: External  ear normal.  Nose: Nose normal.  Mouth/Throat: Oropharynx is clear and moist.  Eyes: Pupils are equal, round, and reactive to light. Conjunctivae and EOM are normal.  Neck: Normal range of motion. Neck supple. No thyromegaly present.  Cardiovascular: Normal rate, regular rhythm, normal heart sounds and intact distal pulses.  Pulmonary/Chest: Effort normal and breath sounds normal.  Abdominal: Soft. Bowel sounds are normal. He exhibits no distension and no mass. There is no abdominal tenderness. There is no rebound and no guarding.  Musculoskeletal: Normal range of motion.  Lymphadenopathy:    He has no cervical adenopathy.  Neurological: He is alert and oriented to person, place, and time. He has normal reflexes.  Skin: Skin is warm and dry.  Psychiatric: He has a normal mood and affect. His behavior is normal. Judgment and thought content normal.    BP 112/61   Pulse 67   Ht 5\' 4"  (1.626 m)   Wt 162 lb (73.5 kg)   SpO2 98%   BMI 27.81  kg/m  Wt Readings from Last 3 Encounters:  11/02/18 162 lb (73.5 kg)  11/01/17 160 lb (72.6 kg)  10/28/16 167 lb (75.8 kg)     There are no preventive care reminders to display for this patient.  There are no preventive care reminders to display for this patient.  Lab Results  Component Value Date   TSH 1.16 10/31/2017   Lab Results  Component Value Date   WBC 4.2 10/31/2017   HGB 14.9 10/31/2017   HCT 43.5 10/31/2017   MCV 91.2 10/31/2017   PLT 206 10/31/2017   Lab Results  Component Value Date   NA 139 10/31/2017   K 4.6 10/31/2017   CO2 27 10/31/2017   GLUCOSE 98 10/31/2017   BUN 17 10/31/2017   CREATININE 0.76 10/31/2017   BILITOT 0.7 10/31/2017   ALKPHOS 60 10/01/2015   AST 17 10/31/2017   ALT 15 10/31/2017   PROT 6.3 10/31/2017   ALBUMIN 3.9 10/01/2015   CALCIUM 9.4 10/31/2017   Lab Results  Component Value Date   CHOL 181 10/31/2017   Lab Results  Component Value Date   HDL 56 10/31/2017   Lab Results  Component Value Date   LDLCALC 110 (H) 10/31/2017   Lab Results  Component Value Date   TRIG 61 10/31/2017   Lab Results  Component Value Date   CHOLHDL 3.2 10/31/2017   Lab Results  Component Value Date   HGBA1C 5.5 10/31/2017      Assessment & Plan:   Problem List Items Addressed This Visit    None    Visit Diagnoses    Wellness examination    -  Primary   Rash       Relevant Orders   Dermatology pathology     Keep up a regular exercise program and make sure you are eating a healthy diet Try to eat 4 servings of dairy a day, or if you are lactose intolerant take a calcium with vitamin D daily.  Your vaccines are up to date.   Rash-could be fungal so I did skin scraping today for further evaluation.  It is itchy at times but more so when it gets irritated when he gets hot or sweaty or when he runs for exercise.  He does not remember specific bug bite etc. he has a lesion on each ankle.  He does have a brother who has psoriasis  and it does almost have a psoriatic appearance.  Will await  KOH sampling before we treat with either topical steroid or antifungal.  No orders of the defined types were placed in this encounter.   Follow-up: Return in about 1 year (around 11/02/2019) for wellness exam .    Beatrice Lecher, MD

## 2018-11-02 NOTE — Progress Notes (Signed)
He c/o his L ankle itching. He has been using hydrocortisone and this hasn't really helped.Maryruth Eve, Lahoma Crocker, CMA

## 2018-11-03 ENCOUNTER — Telehealth: Payer: Self-pay

## 2018-11-03 NOTE — Telephone Encounter (Signed)
Debbie from [facility] called for clarification on sample received. Advised Debbie that per the note it stated "Rash-could be fungal so I did skin scraping today for further evaluation"  Debbie did not have any further questions.

## 2018-11-09 ENCOUNTER — Telehealth: Payer: Self-pay | Admitting: Family Medicine

## 2018-11-09 LAB — COMPLETE METABOLIC PANEL WITH GFR
AG Ratio: 1.8 (calc) (ref 1.0–2.5)
ALT: 11 U/L (ref 9–46)
AST: 19 U/L (ref 10–35)
Albumin: 4.1 g/dL (ref 3.6–5.1)
Alkaline phosphatase (APISO): 67 U/L (ref 35–144)
BUN: 17 mg/dL (ref 7–25)
CO2: 26 mmol/L (ref 20–32)
Calcium: 9.5 mg/dL (ref 8.6–10.3)
Chloride: 104 mmol/L (ref 98–110)
Creat: 0.83 mg/dL (ref 0.70–1.25)
GFR, Est African American: 110 mL/min/{1.73_m2} (ref >=60)
GFR, Est Non African American: 95 mL/min/{1.73_m2} (ref >=60)
Globulin: 2.3 g/dL (calc) (ref 1.9–3.7)
Glucose, Bld: 98 mg/dL (ref 65–99)
Potassium: 4.5 mmol/L (ref 3.5–5.3)
Sodium: 138 mmol/L (ref 135–146)
Total Bilirubin: 0.7 mg/dL (ref 0.2–1.2)
Total Protein: 6.4 g/dL (ref 6.1–8.1)

## 2018-11-09 LAB — CBC WITH DIFFERENTIAL/PLATELET
Absolute Monocytes: 546 cells/uL (ref 200–950)
Basophils Absolute: 41 cells/uL (ref 0–200)
Basophils Relative: 0.8 %
Eosinophils Absolute: 163 cells/uL (ref 15–500)
Eosinophils Relative: 3.2 %
HCT: 42.9 % (ref 38.5–50.0)
Hemoglobin: 14.6 g/dL (ref 13.2–17.1)
Lymphs Abs: 1408 cells/uL (ref 850–3900)
MCH: 31.3 pg (ref 27.0–33.0)
MCHC: 34 g/dL (ref 32.0–36.0)
MCV: 91.9 fL (ref 80.0–100.0)
MPV: 11.9 fL (ref 7.5–12.5)
Monocytes Relative: 10.7 %
Neutro Abs: 2943 cells/uL (ref 1500–7800)
Neutrophils Relative %: 57.7 %
Platelets: 214 10*3/uL (ref 140–400)
RBC: 4.67 10*6/uL (ref 4.20–5.80)
RDW: 12.9 % (ref 11.0–15.0)
Total Lymphocyte: 27.6 %
WBC: 5.1 10*3/uL (ref 3.8–10.8)

## 2018-11-09 LAB — PSA: PSA: 1.9 ng/mL (ref <=4.0)

## 2018-11-09 LAB — LIPID PANEL W/REFLEX DIRECT LDL
Cholesterol: 166 mg/dL (ref <200)
HDL: 54 mg/dL (ref >=40)
LDL Cholesterol (Calc): 98 mg/dL (calc)
Non-HDL Cholesterol (Calc): 112 mg/dL (calc) (ref <130)
Total CHOL/HDL Ratio: 3.1 (calc) (ref <5.0)
Triglycerides: 55 mg/dL (ref <150)

## 2018-11-09 LAB — HEMOGLOBIN A1C W/OUT EAG: Hgb A1c MFr Bld: 5.5 % of total Hgb (ref <5.7)

## 2018-11-09 MED ORDER — TRIAMCINOLONE ACETONIDE 0.5 % EX OINT: 1.0000 "application " | TOPICAL_OINTMENT | Freq: Two times a day (BID) | CUTANEOUS | 0 refills | Status: DC

## 2018-11-09 NOTE — Telephone Encounter (Signed)
Patient calls and states that you were supposed to send in some cream for itching and the pharmacy doesn't have it? Please advise

## 2018-11-09 NOTE — Telephone Encounter (Signed)
OK, I apologize.  Script sent.

## 2019-08-15 ENCOUNTER — Telehealth: Payer: Self-pay | Admitting: Family Medicine

## 2019-08-15 NOTE — Telephone Encounter (Signed)
Colon abstracted.

## 2019-11-22 ENCOUNTER — Encounter: Payer: 59 | Admitting: Family Medicine

## 2019-12-18 ENCOUNTER — Telehealth: Payer: Self-pay

## 2019-12-18 DIAGNOSIS — Z0000 Encounter for general adult medical examination without abnormal findings: Secondary | ICD-10-CM

## 2019-12-18 DIAGNOSIS — E789 Disorder of lipoprotein metabolism, unspecified: Secondary | ICD-10-CM

## 2019-12-18 DIAGNOSIS — R7301 Impaired fasting glucose: Secondary | ICD-10-CM

## 2019-12-18 DIAGNOSIS — Z125 Encounter for screening for malignant neoplasm of prostate: Secondary | ICD-10-CM

## 2019-12-18 NOTE — Telephone Encounter (Signed)
Jahir's wife called about annual labs.

## 2019-12-22 LAB — COMPLETE METABOLIC PANEL WITH GFR
AG Ratio: 2 (calc) (ref 1.0–2.5)
ALT: 12 U/L (ref 9–46)
AST: 19 U/L (ref 10–35)
Albumin: 4.3 g/dL (ref 3.6–5.1)
Alkaline phosphatase (APISO): 60 U/L (ref 35–144)
BUN: 15 mg/dL (ref 7–25)
CO2: 27 mmol/L (ref 20–32)
Calcium: 9.4 mg/dL (ref 8.6–10.3)
Chloride: 102 mmol/L (ref 98–110)
Creat: 0.77 mg/dL (ref 0.70–1.25)
GFR, Est African American: 113 mL/min/{1.73_m2} (ref >=60)
GFR, Est Non African American: 97 mL/min/{1.73_m2} (ref >=60)
Globulin: 2.1 g/dL (calc) (ref 1.9–3.7)
Glucose, Bld: 89 mg/dL (ref 65–99)
Potassium: 4.7 mmol/L (ref 3.5–5.3)
Sodium: 136 mmol/L (ref 135–146)
Total Bilirubin: 0.7 mg/dL (ref 0.2–1.2)
Total Protein: 6.4 g/dL (ref 6.1–8.1)

## 2019-12-22 LAB — CBC WITH DIFFERENTIAL/PLATELET
Absolute Monocytes: 564 cells/uL (ref 200–950)
Basophils Absolute: 59 cells/uL (ref 0–200)
Basophils Relative: 1.2 %
Eosinophils Absolute: 132 cells/uL (ref 15–500)
Eosinophils Relative: 2.7 %
HCT: 43.5 % (ref 38.5–50.0)
Hemoglobin: 15 g/dL (ref 13.2–17.1)
Lymphs Abs: 1602 cells/uL (ref 850–3900)
MCH: 31.8 pg (ref 27.0–33.0)
MCHC: 34.5 g/dL (ref 32.0–36.0)
MCV: 92.4 fL (ref 80.0–100.0)
MPV: 12.1 fL (ref 7.5–12.5)
Monocytes Relative: 11.5 %
Neutro Abs: 2543 cells/uL (ref 1500–7800)
Neutrophils Relative %: 51.9 %
Platelets: 196 10*3/uL (ref 140–400)
RBC: 4.71 10*6/uL (ref 4.20–5.80)
RDW: 12.6 % (ref 11.0–15.0)
Total Lymphocyte: 32.7 %
WBC: 4.9 10*3/uL (ref 3.8–10.8)

## 2019-12-22 LAB — LIPID PANEL W/REFLEX DIRECT LDL
Cholesterol: 163 mg/dL (ref <200)
HDL: 57 mg/dL (ref >=40)
LDL Cholesterol (Calc): 92 mg/dL (calc)
Non-HDL Cholesterol (Calc): 106 mg/dL (calc) (ref <130)
Total CHOL/HDL Ratio: 2.9 (calc) (ref <5.0)
Triglycerides: 54 mg/dL (ref <150)

## 2019-12-22 LAB — HEMOGLOBIN A1C
Hgb A1c MFr Bld: 5.6 % of total Hgb (ref <5.7)
Mean Plasma Glucose: 114 (calc)
eAG (mmol/L): 6.3 (calc)

## 2019-12-22 LAB — PSA: PSA: 2.06 ng/mL (ref <=4.0)

## 2019-12-26 ENCOUNTER — Ambulatory Visit (INDEPENDENT_AMBULATORY_CARE_PROVIDER_SITE_OTHER): Payer: 59 | Admitting: Osteopathic Medicine

## 2019-12-26 ENCOUNTER — Encounter: Payer: Self-pay | Admitting: Osteopathic Medicine

## 2019-12-26 VITALS — BP 123/76 | HR 76 | Temp 98.0°F | Wt 160.0 lb

## 2019-12-26 DIAGNOSIS — H5712 Ocular pain, left eye: Secondary | ICD-10-CM | POA: Diagnosis not present

## 2019-12-26 NOTE — Progress Notes (Signed)
Nicolas Boone is a 64 y.o. male who presents to  [facility] at [facility]  today, 12/26/19, seeking care for the following:   Eye problem - 3 days ago was mowing lawn and tree branch fell on him and struck R ear and eye. was swollen shut but now feels like a pinpoint pain in his central eye.       ASSESSMENT & PLAN with other pertinent findings:  The encounter diagnosis was Eye pain, left.   Normal eye exam limited by non-dilation Fluorescein exam normal Slit lamp exam normal No ulcer/abrasion or FB Lids/conjunctivae normal  Small laceration to forehead and L ear, healing normally OK to monitor Pt c/o pinpoint pain, I can't determine any cause Would refer to ophtho if not resolving in the next few days or f worse/change      Follow-up instructions: Return if symptoms worsen or fail to improve.                                         BP 123/76 (BP Location: Left Arm, Patient Position: Sitting)    Pulse 76    Temp 98 F (36.7 C)    Wt 160 lb (72.6 kg)    SpO2 98%    BMI 27.46 kg/m   Current Meds  Medication Sig   [DISCONTINUED] triamcinolone ointment (KENALOG) 0.5 % Apply 1 application topically 2 (two) times daily.    No results found for this or any previous visit (from the past 72 hour(s)).  No results found.     All questions at time of visit were answered - patient instructed to contact office with any additional concerns or updates.  ER/RTC precautions were reviewed with the patient as applicable.   Please note: voice recognition software was used to produce this document, and typos may escape review. Please contact Dr. Lyn Hollingshead for any needed clarifications.

## 2019-12-27 ENCOUNTER — Other Ambulatory Visit: Payer: Self-pay

## 2019-12-27 ENCOUNTER — Encounter: Payer: Self-pay | Admitting: Family Medicine

## 2019-12-27 ENCOUNTER — Ambulatory Visit (INDEPENDENT_AMBULATORY_CARE_PROVIDER_SITE_OTHER): Payer: 59 | Admitting: Family Medicine

## 2019-12-27 VITALS — BP 123/68 | HR 91 | Ht 64.0 in | Wt 160.0 lb

## 2019-12-27 DIAGNOSIS — R0683 Snoring: Secondary | ICD-10-CM

## 2019-12-27 DIAGNOSIS — L255 Unspecified contact dermatitis due to plants, except food: Secondary | ICD-10-CM | POA: Diagnosis not present

## 2019-12-27 DIAGNOSIS — Z0000 Encounter for general adult medical examination without abnormal findings: Secondary | ICD-10-CM

## 2019-12-27 MED ORDER — TRIAMCINOLONE ACETONIDE 0.5 % EX OINT: 1.0000 "application " | TOPICAL_OINTMENT | Freq: Two times a day (BID) | CUTANEOUS | 0 refills | Status: DC

## 2019-12-27 MED ORDER — PREDNISONE 20 MG PO TABS: ORAL_TABLET | ORAL | 0 refills | Status: AC

## 2019-12-27 MED ORDER — PREDNISONE 20 MG PO TABS: ORAL_TABLET | ORAL | 0 refills | Status: DC

## 2019-12-27 NOTE — Progress Notes (Signed)
CPE  Established Patient Office Visit  Subjective:  Patient ID: Nicolas Boone, male    DOB: 1955/06/10  Age: 64 y.o. MRN: [id number]  CC:  Chief Complaint  Patient presents with  . Annual Exam    HPI Nicolas Boone presents for CPE.  Tdap is up dated.  He still exercises fairly regularly.  He did have a couple of concerns today.  Over the weekend he says that a branch actually hit his face it actually cut his right ear and now he has a erythematous vesicular rash between his eyes over the nasal bridge and he has a lot of pain and irritation in his right eye in fact he actually came in yesterday and saw one of our providers.  He says it is painful, itchy and burns all at the same time it is quite uncomfortable.  He can still see out of his right eye and has not noticed any drainage or discharge.  He also discussed that his snoring has gotten louder over time.  To the point that his wife has been complaining about it he denies any excess daytime sleepiness or morning headaches.  No family history of sleep apnea that he is aware of.  Past Medical History:  Diagnosis Date  . Osteopenia   . Rotator cuff tear 12/18/2015    Past Surgical History:  Procedure Laterality Date  . APPENDECTOMY    . REFRACTIVE SURGERY    . VARICOSE VEIN SURGERY      Family History  Problem Relation Age of Onset  . Throat cancer Father   . Kidney cancer Brother   . Ovarian cancer Sister   . Diabetes Mellitus II Brother   . Diabetes Mellitus II Sister     Social History   Socioeconomic History  . Marital status: Single    Spouse name: Montey Hora  . Number of children: 1  . Years of education: Not on file  . Highest education level: Not on file  Occupational History  . Occupation: Sports administrator: [employer]  Tobacco Use  . Smoking status: Former Smoker    Types: Cigars  . Smokeless tobacco: Never Used  Substance and Sexual Activity  . Alcohol use: Yes  . Drug use: No  . Sexual  activity: Yes    Partners: Female  Other Topics Concern  . 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  He is married to [name] with 1 child.    Social Determinants of Health   Financial Resource Strain:   . Difficulty of Paying Living Expenses: Not on file  Food Insecurity:   . Worried About Programme researcher, broadcasting/film/video in the Last Year: Not on file  . Ran Out of Food in the Last Year: Not on file  Transportation Needs:   . Lack of Transportation (Medical): Not on file  . Lack of Transportation (Non-Medical): Not on file  Physical Activity:   . Days of Exercise per Week: Not on file  . Minutes of Exercise per Session: Not on file  Stress:   . Feeling of Stress : Not on file  Social Connections:   . Frequency of Communication with Friends and Family: Not on file  . Frequency of Social Gatherings with Friends and Family: Not on file  . Attends Religious Services: Not on file  . Active Member of Clubs or Organizations: Not on file  . Attends Banker Meetings: Not  on file  . Marital Status: Not on file  Intimate Partner Violence:   . Fear of Current or Ex-Partner: Not on file  . Emotionally Abused: Not on file  . Physically Abused: Not on file  . Sexually Abused: Not on file    Outpatient Medications Prior to Visit  Medication Sig Dispense Refill  . triamcinolone ointment (KENALOG) 0.5 % Apply 1 application topically 2 (two) times daily. 45 g 0   No facility-administered medications prior to visit.    Allergies  Allergen Reactions  . Bee Venom   . Nutritional Supplements     Needs epi-pen rx    ROS Review of Systems    Objective:    Physical Exam Constitutional:      Appearance: He is well-developed.  HENT:     Head: Normocephalic and atraumatic.     Right Ear: External ear normal.     Left Ear: External ear normal.     Nose: Nose normal.  Eyes:     Conjunctiva/sclera: Conjunctivae normal.     Pupils:  Pupils are equal, round, and reactive to light.  Neck:     Thyroid: No thyromegaly.  Cardiovascular:     Rate and Rhythm: Normal rate and regular rhythm.     Heart sounds: Normal heart sounds.  Pulmonary:     Effort: Pulmonary effort is normal.     Breath sounds: Normal breath sounds.  Abdominal:     General: Bowel sounds are normal. There is no distension.     Palpations: Abdomen is soft. There is no mass.     Tenderness: There is no abdominal tenderness. There is no guarding or rebound.  Musculoskeletal:        General: Normal range of motion.     Cervical back: Normal range of motion and neck supple.  Lymphadenopathy:     Cervical: No cervical adenopathy.  Skin:    General: Skin is warm and dry.     Comments: The skin over his nasal bridge is erythematous maculopapular with small vesicles.  No active drainage.  Along his lower lid he has a few vesicles and erythema.  On the right outer ear he has an abrasion with some scabbing as well as some mild erythema and swelling.  Neurological:     Mental Status: He is alert and oriented to person, place, and time.     Deep Tendon Reflexes: Reflexes are normal and symmetric.  Psychiatric:        Behavior: Behavior normal.        Thought Content: Thought content normal.        Judgment: Judgment normal.     BP 123/68   Pulse 91   Ht 5\' 4"  (1.626 m)   Wt 160 lb (72.6 kg)   SpO2 98%   BMI 27.46 kg/m  Wt Readings from Last 3 Encounters:  12/27/19 160 lb (72.6 kg)  12/26/19 160 lb (72.6 kg)  11/02/18 162 lb (73.5 kg)     There are no preventive care reminders to display for this patient.  There are no preventive care reminders to display for this patient.  Lab Results  Component Value Date   TSH 1.16 10/31/2017   Lab Results  Component Value Date   WBC 4.9 12/21/2019   HGB 15.0 12/21/2019   HCT 43.5 12/21/2019   MCV 92.4 12/21/2019   PLT 196 12/21/2019   Lab Results  Component Value Date   NA 136 12/21/2019   K 4.7  12/21/2019   CO2 27 12/21/2019   GLUCOSE 89 12/21/2019   BUN 15 12/21/2019   CREATININE 0.77 12/21/2019   BILITOT 0.7 12/21/2019   ALKPHOS 60 10/01/2015   AST 19 12/21/2019   ALT 12 12/21/2019   PROT 6.4 12/21/2019   ALBUMIN 3.9 10/01/2015   CALCIUM 9.4 12/21/2019   Lab Results  Component Value Date   CHOL 163 12/21/2019   Lab Results  Component Value Date   HDL 57 12/21/2019   Lab Results  Component Value Date   LDLCALC 92 12/21/2019   Lab Results  Component Value Date   TRIG 54 12/21/2019   Lab Results  Component Value Date   CHOLHDL 2.9 12/21/2019   Lab Results  Component Value Date   HGBA1C 5.6 12/21/2019      Assessment & Plan:   Problem List Items Addressed This Visit      Other   Snoring    Stop bang score of 4 today for snoring, fatigue, age and gender.  Discussed sleep study options he would prefer to do a home sleep studies we will get that order placed and set up.      Relevant Orders   Home sleep test    Other Visit Diagnoses    Rhus dermatitis    -  Primary   Relevant Medications   predniSONE (DELTASONE) 20 MG tablet   triamcinolone ointment (KENALOG) 0.5 %   Wellness examination          Keep up a regular exercise program and make sure you are eating a healthy diet Try to eat 4 servings of dairy a day, or if you are lactose intolerant take a calcium with vitamin D daily.  Your vaccines are up to date.   Suspect that the rash on his face does most consistent with Rhus dermatitis it is over the nasal bridge and along the lower eyelid.  Also some affected area on the right outer ear as well as some scabbing.  Recommend treatment with oral prednisone and topical steroid cream but reminded him not to put the steroid cream anywhere near his eyeball.  He said he took steroids about 20 years ago and says it caused some temporary vision loss right after he had just had Lasix surgery.  Little bit on usual symptom and side effect of prednisone did  encourage him to stop the medication immediately if he experiences any new symptoms.   Meds ordered this encounter  Medications  . DISCONTD: predniSONE (DELTASONE) 20 MG tablet    Sig: Take 2 tablets (40 mg total) by mouth daily with breakfast for 4 days, THEN 1 tablet (20 mg total) daily with breakfast for 4 days, THEN 0.5 tablets (10 mg total) daily with breakfast for 4 days.    Dispense:  14 tablet    Refill:  0  . DISCONTD: triamcinolone ointment (KENALOG) 0.5 %    Sig: Apply 1 application topically 2 (two) times daily.    Dispense:  15 g    Refill:  0  . predniSONE (DELTASONE) 20 MG tablet    Sig: Take 2 tablets (40 mg total) by mouth daily with breakfast for 4 days, THEN 1 tablet (20 mg total) daily with breakfast for 4 days, THEN 0.5 tablets (10 mg total) daily with breakfast for 4 days.    Dispense:  14 tablet    Refill:  0  . triamcinolone ointment (KENALOG) 0.5 %    Sig: Apply 1 application topically 2 (two)  times daily.    Dispense:  15 g    Refill:  0    Follow-up: Return if symptoms worsen or fail to improve.    Nani Gasseratherine Maleek Craver, MD

## 2019-12-27 NOTE — Patient Instructions (Signed)

## 2019-12-27 NOTE — Assessment & Plan Note (Signed)
Stop bang score of 4 today for snoring, fatigue, age and gender.  Discussed sleep study options he would prefer to do a home sleep studies we will get that order placed and set up.

## 2020-09-15 ENCOUNTER — Other Ambulatory Visit: Payer: Self-pay

## 2020-09-15 ENCOUNTER — Emergency Department
Admission: EM | Admit: 2020-09-15 | Discharge: 2020-09-15 | Disposition: A | Discharge status: Home / Self Care | Payer: 59 | Source: Home / Self Care | Attending: Family Medicine | Admitting: Family Medicine

## 2020-09-15 ENCOUNTER — Emergency Department (INDEPENDENT_AMBULATORY_CARE_PROVIDER_SITE_OTHER): Payer: 59

## 2020-09-15 DIAGNOSIS — M25562 Pain in left knee: Secondary | ICD-10-CM

## 2020-09-15 DIAGNOSIS — M2392 Unspecified internal derangement of left knee: Secondary | ICD-10-CM

## 2020-09-15 DIAGNOSIS — M810 Age-related osteoporosis without current pathological fracture: Secondary | ICD-10-CM

## 2020-09-15 MED ORDER — DICLOFENAC SODIUM 50 MG PO TBEC: 50.0000 mg | DELAYED_RELEASE_TABLET | Freq: Two times a day (BID) | ORAL | 0 refills | Status: DC

## 2020-09-15 MED ORDER — NAPROXEN SODIUM 550 MG PO TABS: 550.0000 mg | ORAL_TABLET | Freq: Two times a day (BID) | ORAL | 0 refills | Status: DC

## 2020-09-15 NOTE — Discharge Instructions (Addendum)
Limit activity while knee is painful Take voltaren 2 times a day with food Ice painful knee for 20 minutes every few hours If not improved towards the end of the week call your primary care for physical therapy advice or schedule an appointment with Dr. Benjamin Stain (sports medicine)

## 2020-09-15 NOTE — ED Triage Notes (Signed)
Pt presents to Urgent Care with c/o L knee pain x 3 weeks. Reports he does not recall specific injury, but noticed the pain after running one day (runs regularly).

## 2020-09-15 NOTE — ED Provider Notes (Signed)
[facility]    CSN: [id number] Arrival date & time: 09/15/20  8416      History   Chief Complaint Chief Complaint  Patient presents with   Knee Pain    Left    HPI Nicolas Boone is a 65 y.o. male.   HPI  Patient is here for knee pain.  He states that he runs 3 miles 3 or more times a week.  He has not had any significant knee problems in the past.  He states he did have a fall 3 weeks ago.  He states that he is developed pain in his left knee, pain with weightbearing that is getting worse.  No swelling.  No clicking.  He states he has not had any locking but at times it feels like it may buckle.  He is eager to get back to his regular activity.  He states he is unable to take ibuprofen or naproxen because they both make him feel anxious.  He has not been taking medicine for his knee.  He has reduced his activity level.  Patient states that he was diagnosed with osteoporosis in 2015.  He states he was never worked up for osteoporosis and it is not known why he has low bone density.  He does not eat a lot of calcium foods and drinks milk infrequently.  He states he is never had a vitamin D level checked or taken vitamin D supplements.  He remembers briefly taking Fosamax.  He has not had another DEXA scan since his test in 2015.  He states he is lost 4 inches in height.  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 Authorizing Provider  diclofenac (VOLTAREN) 50 MG EC tablet Take 1 tablet (50 mg total) by mouth 2 (two) times daily. 09/15/20  Yes Eustace Moore, MD  triamcinolone ointment (KENALOG) 0.5 % Apply 1 application topically 2 (two) times daily. 12/27/19   Agapito Games, MD    Family History Family History  Problem Relation Age of Onset   Throat cancer Father    Ovarian cancer Sister    Diabetes Mellitus II Sister    Kidney cancer Brother    Diabetes Mellitus II Brother     Social History Social History   Tobacco Use   Smoking status: Former    Pack years: 0.00    Types: Cigars   Smokeless tobacco: Never  Vaping Use   Vaping Use: Never used  Substance Use Topics   Alcohol use: Yes    Alcohol/week: 3.0 - 4.0 standard drinks    Types: 3 - 4 Standard drinks or equivalent per week  Comment: weekly   Drug use: No     Allergies   Bee venom and Nutritional supplements   Review of Systems Review of Systems See HPI  Physical Exam Triage Vital Signs ED Triage Vitals  Enc Vitals Group     BP 09/15/20 0844 (!) 144/80     Pulse Rate 09/15/20 0844 69     Resp 09/15/20 0844 20     Temp 09/15/20 0844 97.9 F (36.6 C)     Temp src --      SpO2 09/15/20 0844 98 %     Weight 09/15/20 0837 155 lb (70.3 kg)     Height 09/15/20 0837 5\' 4"  (1.626 m)     Head Circumference --      Peak Flow --      Pain Score 09/15/20 0836 7     Pain Loc --      Pain Edu? --      Excl. in GC? --    No data found.  Updated Vital Signs BP (!) 144/80   Pulse 69   Temp 97.9 F (36.6 C)   Resp 20   Ht 5\' 4"  (1.626 m)   Wt 70.3 kg   SpO2 98%   BMI 26.61 kg/m       Physical Exam Constitutional:      General: He is not in acute distress.    Appearance: He is well-developed and normal weight.  HENT:     Head:  Normocephalic and atraumatic.  Eyes:     Conjunctiva/sclera: Conjunctivae normal.     Pupils: Pupils are equal, round, and reactive to light.  Cardiovascular:     Rate and Rhythm: Normal rate.  Pulmonary:     Effort: Pulmonary effort is normal. No respiratory distress.  Abdominal:     General: There is no distension.     Palpations: Abdomen is soft.  Musculoskeletal:     Cervical back: Normal range of motion.     Right knee: Normal.     Left knee: No swelling, deformity, effusion, bony tenderness or crepitus. Decreased range of motion. Tenderness present over the medial joint line. Normal pulse.     Instability Tests: Anterior drawer test negative. Posterior drawer test negative.     Comments: Mild thoracic kyphosis.  Skin:    General: Skin is warm and dry.  Neurological:     Mental Status: He is alert.     Gait: Gait abnormal.  Psychiatric:        Mood and Affect: Mood normal.        Behavior: Behavior normal.     UC Treatments / Results  Labs (all labs ordered are listed, but only abnormal results are displayed) Labs Reviewed - No data to display  EKG   Radiology DG Knee Complete 4 Views Left  Result Date: 09/15/2020 CLINICAL DATA:  Left knee pain. EXAM: LEFT KNEE - COMPLETE 4+ VIEW COMPARISON:  None. FINDINGS: No evidence of fracture, dislocation, or joint effusion. No evidence of arthropathy or other focal bone abnormality. Soft tissues are unremarkable. IMPRESSION: Negative. Electronically Signed   By: M.D.   On: 09/15/2020 09:28    Procedures Procedures (including critical care time)  Medications Ordered in UC Medications - No data to display  Initial Impression / Assessment and Plan / UC Course  I have reviewed the triage vital signs and the nursing notes.  Pertinent labs & imaging results that were available during my care of the patient  were reviewed by me and considered in my medical decision making (see chart for details).     X-ray to  my evaluation does look osteopenic.  Otherwise normal.  His symptoms are suggestive of an internal derangement or degenerative tear.  Recommend conservative care with ice, anti-inflammatories, rest, and physical therapy.  Offered a brace.  Patient declines physical therapy and brace. Final Clinical Impressions(s) / UC Diagnoses   Final diagnoses:  Acute pain of left knee  Internal derangement of left knee  Osteoporosis without current pathological fracture, unspecified osteoporosis type     Discharge Instructions      Limit activity while knee is painful Take voltaren 2 times a day with food Ice painful knee for 20 minutes every few hours If not improved towards the end of the week call your primary care for physical therapy advice or schedule an appointment with Dr. Benjamin Stain (sports medicine)   ED Prescriptions     Medication Sig Dispense Auth. Provider         diclofenac (VOLTAREN) 50 MG EC tablet Take 1 tablet (50 mg total) by mouth 2 (two) times daily. 30 tablet Eustace Moore, MD      PDMP not reviewed this encounter.   Eustace Moore, MD 09/15/20 [phone]

## 2020-09-17 ENCOUNTER — Telehealth: Payer: Self-pay

## 2020-09-17 ENCOUNTER — Telehealth (INDEPENDENT_AMBULATORY_CARE_PROVIDER_SITE_OTHER): Payer: 59 | Admitting: Sports Medicine

## 2020-09-17 DIAGNOSIS — S8992XA Unspecified injury of left lower leg, initial encounter: Secondary | ICD-10-CM

## 2020-09-17 MED ORDER — TRAMADOL HCL 50 MG PO TABS: 50.0000 mg | ORAL_TABLET | Freq: Three times a day (TID) | ORAL | 0 refills | Status: DC | PRN

## 2020-09-17 NOTE — Telephone Encounter (Signed)
Patient called back to report that his prescriptions were sent to the wrong pharmacy. They were sent to the [facility] and he lives in [place]. Please re-send to [facility] indicated in chart.

## 2020-09-17 NOTE — Telephone Encounter (Signed)
Patient aware prescriptions were re-sent to correct pharmacy.

## 2020-09-17 NOTE — Assessment & Plan Note (Signed)
Nicolas Boone is a pleasant 65 year old male, he enjoys running, last week he was running, then over a period of time he started to feel severe pain in the posterior aspect of his left knee, mechanical symptoms including catching, he cannot bend it past 90 degrees. He was seen in urgent care, x-rays did show osteoarthritis. He was appropriately prescribed Voltaren which he did not tolerate. Unfortunately he also got COVID, recently diagnosed on Monday. For this reason he will need to be in isolation for 5 days followed by tight mask wear for 5 days, for this reason I think he can come by to see me on Monday, I will add tramadol for pain and we will consider an MRI. (Looks like there might not be an MRI schedule on Tuesday) My suspicion is subchondral insufficiency fracture or meniscal tear. If there is an effusion we will certainly drain and inject it.

## 2020-09-17 NOTE — Progress Notes (Signed)
   Virtual Visit via Telephone   I connected with  Nicolas Boone  on 09/17/20 by telephone/telehealth and verified that I am speaking with the correct person using two identifiers.   I discussed the limitations, risks, security and privacy concerns of performing an evaluation and management service by telephone, including the higher likelihood of inaccurate diagnosis and treatment, and the availability of in person appointments.  We also discussed the likely need of an additional face to face encounter for complete and high quality delivery of care.  I also discussed with the patient that there may be a patient responsible charge related to this service. The patient expressed understanding and wishes to proceed.  Provider location is in medical facility. Patient location is at their home, different from provider location. People involved in care of the patient during this telehealth encounter were myself, my nurse/medical assistant, and my front office/scheduling team member.  Review of Systems: No fevers, chills, night sweats, weight loss, chest pain, or shortness of breath.   Objective Findings:    General: Speaking full sentences, no audible heavy breathing.  Sounds alert and appropriately interactive.    Independent interpretation of tests performed by another provider:   X-rays personally reviewed and show mild osteoarthritis with spurring of the tibial spines.  Brief History, Exam, Impression, and Recommendations:    Left knee injury Nicolas Boone is a pleasant 65 year old male, he enjoys running, last week he was running, then over a period of time he started to feel severe pain in the posterior aspect of his left knee, mechanical symptoms including catching, he cannot bend it past 90 degrees. He was seen in urgent care, x-rays did show osteoarthritis. He was appropriately prescribed Voltaren which he did not tolerate. Unfortunately he also got COVID, recently diagnosed on Monday. For  this reason he will need to be in isolation for 5 days followed by tight mask wear for 5 days, for this reason I think he can come by to see me on Monday, I will add tramadol for pain and we will consider an MRI. (Looks like there might not be an MRI schedule on Tuesday) My suspicion is subchondral insufficiency fracture or meniscal tear. If there is an effusion we will certainly drain and inject it.   I discussed the above assessment and treatment plan with the patient. The patient was provided an opportunity to ask questions and all were answered. The patient agreed with the plan and demonstrated an understanding of the instructions.   The patient was advised to call back or seek an in-person evaluation if the symptoms worsen or if the condition fails to improve as anticipated.   I provided 30 minutes of verbal and non-verbal time during this encounter date, time was needed to gather information, review chart, records, communicate/coordinate with staff remotely, as well as complete documentation.   ___________________________________________ Ihor Austin. Benjamin Stain, M.D., ABFM., CAQSM. Primary Care and Sports Medicine [facility]  Adjunct Professor of Family Medicine  [employer]

## 2020-09-17 NOTE — Telephone Encounter (Signed)
Done

## 2020-09-23 ENCOUNTER — Ambulatory Visit: Payer: 59 | Admitting: Sports Medicine

## 2020-09-23 ENCOUNTER — Other Ambulatory Visit: Payer: Self-pay

## 2020-09-23 DIAGNOSIS — S8992XA Unspecified injury of left lower leg, initial encounter: Secondary | ICD-10-CM

## 2020-09-23 DIAGNOSIS — S8992XD Unspecified injury of left lower leg, subsequent encounter: Secondary | ICD-10-CM

## 2020-09-23 NOTE — Assessment & Plan Note (Signed)
Nicolas Boone is a very pleasant 65 year old male, he has been running since he retired, unfortunately he has developed pain in his left knee, posterior joint line with locking, catching, mechanical symptoms, pain. We treated him conservatively with some tramadol after a video visit. He has improved to some degree but still has mechanical symptoms and pain limiting his activities. On exam he has pain with terminal flexion, no effusion, positive McMurray's sign with pain but no palpable catch. X-rays done several days ago did confirm osteoarthritis on my personal read. Tramadol at a full tab did make him sleepy so he will switch to a half tab. Considering mechanical symptoms we will proceed with an MRI, if I do see a meniscal tear we will likely do an injection. Continue bracing, crosstraining on a recumbent bike, half tabs of tramadol, return to see me for MRI results.

## 2020-09-23 NOTE — Progress Notes (Signed)
    Procedures performed today:    None.  Independent interpretation of notes and tests performed by another provider:   X-rays personally reviewed and do show some osteoarthritis with spurring of the tibial spines.  Brief History, Exam, Impression, and Recommendations:    Left knee injury Nicolas Boone is a very pleasant 65 year old male, he has been running since he retired, unfortunately he has developed pain in his left knee, posterior joint line with locking, catching, mechanical symptoms, pain. We treated him conservatively with some tramadol after a video visit. He has improved to some degree but still has mechanical symptoms and pain limiting his activities. On exam he has pain with terminal flexion, no effusion, positive McMurray's sign with pain but no palpable catch. X-rays done several days ago did confirm osteoarthritis on my personal read. Tramadol at a full tab did make him sleepy so he will switch to a half tab. Considering mechanical symptoms we will proceed with an MRI, if I do see a meniscal tear we will likely do an injection. Continue bracing, crosstraining on a recumbent bike, half tabs of tramadol, return to see me for MRI results.    ___________________________________________ Ihor Austin. Benjamin Stain, M.D., ABFM., CAQSM. Primary Care and Sports Medicine [facility]  Adjunct Instructor of Family Medicine  [employer]

## 2020-09-27 ENCOUNTER — Other Ambulatory Visit: Payer: 59

## 2020-12-29 ENCOUNTER — Other Ambulatory Visit: Payer: Self-pay | Admitting: *Deleted

## 2020-12-29 DIAGNOSIS — Z0000 Encounter for general adult medical examination without abnormal findings: Secondary | ICD-10-CM

## 2020-12-29 DIAGNOSIS — Z125 Encounter for screening for malignant neoplasm of prostate: Secondary | ICD-10-CM

## 2020-12-31 LAB — COMPLETE METABOLIC PANEL WITH GFR
AG Ratio: 1.9 (calc) (ref 1.0–2.5)
ALT: 14 U/L (ref 9–46)
AST: 20 U/L (ref 10–35)
Albumin: 4.2 g/dL (ref 3.6–5.1)
Alkaline phosphatase (APISO): 69 U/L (ref 35–144)
BUN: 14 mg/dL (ref 7–25)
CO2: 27 mmol/L (ref 20–32)
Calcium: 9.5 mg/dL (ref 8.6–10.3)
Chloride: 102 mmol/L (ref 98–110)
Creat: 0.79 mg/dL (ref 0.70–1.35)
Globulin: 2.2 g/dL (calc) (ref 1.9–3.7)
Glucose, Bld: 89 mg/dL (ref 65–99)
Potassium: 4.9 mmol/L (ref 3.5–5.3)
Sodium: 137 mmol/L (ref 135–146)
Total Bilirubin: 0.5 mg/dL (ref 0.2–1.2)
Total Protein: 6.4 g/dL (ref 6.1–8.1)
eGFR: 100 mL/min/{1.73_m2} (ref >=60)

## 2020-12-31 LAB — LIPID PANEL W/REFLEX DIRECT LDL
Cholesterol: 175 mg/dL (ref <200)
HDL: 53 mg/dL (ref >=40)
LDL Cholesterol (Calc): 107 mg/dL (calc) — ABNORMAL HIGH
Non-HDL Cholesterol (Calc): 122 mg/dL (calc) (ref <130)
Total CHOL/HDL Ratio: 3.3 (calc) (ref <5.0)
Triglycerides: 64 mg/dL (ref <150)

## 2020-12-31 LAB — HEMOGLOBIN A1C
Hgb A1c MFr Bld: 5.5 % of total Hgb (ref <5.7)
Mean Plasma Glucose: 111 mg/dL
eAG (mmol/L): 6.2 mmol/L

## 2020-12-31 LAB — CBC WITH DIFFERENTIAL/PLATELET
Absolute Monocytes: 616 cells/uL (ref 200–950)
Basophils Absolute: 91 cells/uL (ref 0–200)
Basophils Relative: 1.6 %
Eosinophils Absolute: 308 cells/uL (ref 15–500)
Eosinophils Relative: 5.4 %
HCT: 43.9 % (ref 38.5–50.0)
Hemoglobin: 15.2 g/dL (ref 13.2–17.1)
Lymphs Abs: 1642 cells/uL (ref 850–3900)
MCH: 31.8 pg (ref 27.0–33.0)
MCHC: 34.6 g/dL (ref 32.0–36.0)
MCV: 91.8 fL (ref 80.0–100.0)
MPV: 11.3 fL (ref 7.5–12.5)
Monocytes Relative: 10.8 %
Neutro Abs: 3044 cells/uL (ref 1500–7800)
Neutrophils Relative %: 53.4 %
Platelets: 215 10*3/uL (ref 140–400)
RBC: 4.78 10*6/uL (ref 4.20–5.80)
RDW: 12.9 % (ref 11.0–15.0)
Total Lymphocyte: 28.8 %
WBC: 5.7 10*3/uL (ref 3.8–10.8)

## 2020-12-31 LAB — PSA: PSA: 2.27 ng/mL (ref <=4.00)

## 2020-12-31 NOTE — Progress Notes (Signed)
Your lab work is within acceptable range and there are no concerning findings.   ?

## 2021-01-01 ENCOUNTER — Encounter: Payer: Self-pay | Admitting: Family Medicine

## 2021-01-01 ENCOUNTER — Other Ambulatory Visit: Payer: Self-pay

## 2021-01-01 ENCOUNTER — Ambulatory Visit (INDEPENDENT_AMBULATORY_CARE_PROVIDER_SITE_OTHER): Payer: 59 | Admitting: Family Medicine

## 2021-01-01 VITALS — BP 116/62 | HR 67 | Ht 64.0 in | Wt 155.0 lb

## 2021-01-01 DIAGNOSIS — L255 Unspecified contact dermatitis due to plants, except food: Secondary | ICD-10-CM | POA: Diagnosis not present

## 2021-01-01 DIAGNOSIS — Z0000 Encounter for general adult medical examination without abnormal findings: Secondary | ICD-10-CM | POA: Diagnosis not present

## 2021-01-01 DIAGNOSIS — M4004 Postural kyphosis, thoracic region: Secondary | ICD-10-CM | POA: Diagnosis not present

## 2021-01-01 MED ORDER — TRIAMCINOLONE ACETONIDE 0.5 % EX OINT: 1.0000 "application " | TOPICAL_OINTMENT | Freq: Two times a day (BID) | CUTANEOUS | 0 refills | Status: DC | PRN

## 2021-01-01 NOTE — Patient Instructions (Addendum)
You can try Voltaren gel for your knee arthritis it can be applied up to 3 times a day and on up to 4 joints at 1 time. I encourage you to get your shingles vaccine.

## 2021-01-01 NOTE — Progress Notes (Signed)
CPE  Subjective:  Patient ID: Nicolas Boone, male    DOB: [birth year]  Age: 65 y.o. MRN: [id number]  CC:  Chief Complaint  Patient presents with   Annual Exam    HPI Nicolas Boone presents for CPE.  He walks for exercise 5 to 6 days a week and runs every other day.  He is planning on participating in a 5K in about 2 weeks.  He feels like overall his mood is okay he has struggled a little bit more since COVID and getting sick around that time.  He would like a refill on the triamcinolone he usually uses it for bug bites and poison ivy and it seems to work well and he would just like to have a tube to use.  He does feel like his kyphosis is getting a little worse.  He did fall this last year he said he tripped over some roots in the woods and thought maybe he cracked a rib but never sought care for it.  Past Medical History:  Diagnosis Date   Osteopenia    Rotator cuff tear 12/18/2015    Past Surgical History:  Procedure Laterality Date   APPENDECTOMY     REFRACTIVE SURGERY     VARICOSE VEIN SURGERY      Family History  Problem Relation Age of Onset   Throat cancer Father    Ovarian cancer Sister    Diabetes Mellitus II Sister    Kidney cancer Brother    Diabetes Mellitus II Brother     Social History   Socioeconomic History   Marital status: Married    Spouse name: Nicolas Boone   Number of children: 1   Years of education: Not on file   Highest education level: Not on file  Occupational History   Occupation: Music therapist: [employer]  Boone Use   Smoking status: Former    Types: Cigars   Smokeless Boone: Never  Vaping Use   Vaping Use: Never used  Substance and Sexual Activity   Alcohol use: Yes    Alcohol/week: 3.0 - 4.0 standard drinks    Types: 3 - 4 Standard drinks or equivalent per week    Comment: weekly   Drug use: No   Sexual activity: Yes    Partners: Female  Other Topics Concern   Not on file  Social History Narrative   He  is a Conservation officer, nature for [employer].  He is married to Nicolas Boone with 1 child. He retired at age 89.    Social Determinants of Health   Financial Resource Strain: Not on file  Food Insecurity: Not on file  Transportation Needs: Not on file  Physical Activity: Not on file  Stress: Not on file  Social Connections: Not on file  Intimate Partner Violence: Not on file    Outpatient Medications Prior to Visit  Medication Sig Dispense Refill   diclofenac (VOLTAREN) 50 MG EC tablet Take 1 tablet (50 mg total) by mouth 2 (two) times daily. 30 tablet 0   traMADol (ULTRAM) 50 MG tablet Take 0.5 tablets (25 mg total) by mouth every 8 (eight) hours as needed for moderate pain. Maximum 6 tabs per day.     triamcinolone ointment (KENALOG) 0.5 % Apply 1 application topically 2 (two) times daily. 15 g 0   No facility-administered medications prior to visit.    Allergies  Allergen Reactions   Bee Venom    Nutritional Supplements Nausea And Vomiting  Slim Fast     ROS Review of Systems    Objective:    Physical Exam Constitutional:      Appearance: Normal appearance. He is well-developed.  HENT:     Head: Normocephalic and atraumatic.     Right Ear: External ear normal.     Left Ear: External ear normal.     Nose: Nose normal.  Eyes:     Conjunctiva/sclera: Conjunctivae normal.     Pupils: Pupils are equal, round, and reactive to light.  Neck:     Thyroid: No thyromegaly.  Cardiovascular:     Rate and Rhythm: Normal rate and regular rhythm.     Heart sounds: Normal heart sounds.  Pulmonary:     Effort: Pulmonary effort is normal.     Breath sounds: Normal breath sounds.  Abdominal:     General: Bowel sounds are normal. There is no distension.     Palpations: Abdomen is soft. There is no mass.     Tenderness: There is no abdominal tenderness. There is no guarding or rebound.  Musculoskeletal:        General: Normal range of motion.     Cervical back: Normal range of  motion and neck supple.  Lymphadenopathy:     Cervical: No cervical adenopathy.  Skin:    General: Skin is warm and dry.  Neurological:     Mental Status: He is alert and oriented to person, place, and time. Mental status is at baseline.     Deep Tendon Reflexes: Reflexes are normal and symmetric.  Psychiatric:        Behavior: Behavior normal.        Thought Content: Thought content normal.        Judgment: Judgment normal.    BP 116/62   Pulse 67   Ht _0  (1.626 m)   Wt 155 lb (70.3 kg)   SpO2 98%   BMI 26.61 kg/m  Wt Readings from Last 3 Encounters:  01/01/21 155 lb (70.3 kg)  09/15/20 155 lb (70.3 kg)  12/27/19 160 lb (72.6 kg)     Health Maintenance Due  Topic Date Due   Zoster Vaccines- Shingrix (1 of 2) Never done   COVID-19 Vaccine (3 - Booster for Moderna series) 03/13/2020    There are no preventive care reminders to display for this patient.  Lab Results  Component Value Date   TSH 1.16 10/31/2017   Lab Results  Component Value Date   WBC 5.7 12/30/2020   HGB 15.2 12/30/2020   HCT 43.9 12/30/2020   MCV 91.8 12/30/2020   PLT 215 12/30/2020   Lab Results  Component Value Date   NA 137 12/30/2020   K 4.9 12/30/2020   CO2 27 12/30/2020   GLUCOSE 89 12/30/2020   BUN 14 12/30/2020   CREATININE 0.79 12/30/2020   BILITOT 0.5 12/30/2020   ALKPHOS 60 10/01/2015   AST 20 12/30/2020   ALT 14 12/30/2020   PROT 6.4 12/30/2020   ALBUMIN 3.9 10/01/2015   CALCIUM 9.5 12/30/2020   EGFR 100 12/30/2020   Lab Results  Component Value Date   CHOL 175 12/30/2020   Lab Results  Component Value Date   HDL 53 12/30/2020   Lab Results  Component Value Date   LDLCALC 107 (H) 12/30/2020   Lab Results  Component Value Date   TRIG 64 12/30/2020   Lab Results  Component Value Date   CHOLHDL 3.3 12/30/2020   Lab Results  Component Value  Date   HGBA1C 5.5 12/30/2020      Assessment & Plan:   Problem List Items Addressed This Visit   None Visit  Diagnoses     Wellness examination    -  Primary   Rhus dermatitis       Relevant Medications   triamcinolone ointment (KENALOG) 0.5 %   Postural kyphosis of thoracic region       Relevant Orders   DG Bone Density      Keep up a regular exercise program and make sure you are eating a healthy diet Try to eat 4 servings of dairy a day, or if you are lactose intolerant take a calcium with vitamin D daily.  Your vaccines are up to date.    Meds ordered this encounter  Medications   triamcinolone ointment (KENALOG) 0.5 %    Sig: Apply 1 application topically 2 (two) times daily as needed.    Dispense:  30 g    Refill:  0    Follow-up: Return in about 1 year (around 01/01/2022) for Wellness Exam.    Beatrice Lecher, MD

## 2021-01-28 ENCOUNTER — Ambulatory Visit (INDEPENDENT_AMBULATORY_CARE_PROVIDER_SITE_OTHER): Payer: 59

## 2021-01-28 ENCOUNTER — Other Ambulatory Visit: Payer: Self-pay

## 2021-01-28 DIAGNOSIS — M4004 Postural kyphosis, thoracic region: Secondary | ICD-10-CM

## 2021-01-29 NOTE — Progress Notes (Signed)
Hi Jaylen, the bone density test shows a T score -2.5 which is consistent with osteoporosis.  The recommendation is for adequate treatment to improve the thinness of your bones.   The current recommendation for osteoporosis treatment includes:   #1 calcium-total of 1200 mg of calcium daily.  If you eat a very calcium rich diet you may be able to obtain that without a supplement.  If not, then I recommend calcium 500 mg twice a day.  There are several products over-the-counter such as Caltrate D and Viactiv chews which are great options that contain calcium and vitamin D. #2 vitamin D-recommend 800 international units daily. #3 exercise-recommend 30 minutes of weightbearing exercise 3 days a week.  Resistance training ,such as doing bands and light weights, can be particularly helpful. #4 medication-if you are not currently on a bone builder, also called a bisphosphonate, then this has been shown to be very helpful in maintaining bone strength, preventing further thinning of the bones, and reducing your risk for fractures.  I would highly recommend that you consider starting 1 of these medications.  If you are okay with that then please let us know and we will send one to your pharmacy.  If you would like to discuss further we are happy to make an appointment for you so that we can go over options for treatment.

## 2022-01-18 ENCOUNTER — Other Ambulatory Visit: Payer: Self-pay | Admitting: *Deleted

## 2022-01-18 DIAGNOSIS — Z13220 Encounter for screening for lipoid disorders: Secondary | ICD-10-CM

## 2022-01-18 DIAGNOSIS — R7301 Impaired fasting glucose: Secondary | ICD-10-CM

## 2022-01-18 DIAGNOSIS — Z125 Encounter for screening for malignant neoplasm of prostate: Secondary | ICD-10-CM

## 2022-01-19 LAB — COMPLETE METABOLIC PANEL WITH GFR
AG Ratio: 1.8 (calc) (ref 1.0–2.5)
ALT: 12 U/L (ref 9–46)
AST: 17 U/L (ref 10–35)
Albumin: 4.2 g/dL (ref 3.6–5.1)
Alkaline phosphatase (APISO): 82 U/L (ref 35–144)
BUN: 15 mg/dL (ref 7–25)
CO2: 24 mmol/L (ref 20–32)
Calcium: 9.4 mg/dL (ref 8.6–10.3)
Chloride: 104 mmol/L (ref 98–110)
Creat: 0.76 mg/dL (ref 0.70–1.35)
Globulin: 2.4 g/dL (calc) (ref 1.9–3.7)
Glucose, Bld: 93 mg/dL (ref 65–99)
Potassium: 4.7 mmol/L (ref 3.5–5.3)
Sodium: 138 mmol/L (ref 135–146)
Total Bilirubin: 0.6 mg/dL (ref 0.2–1.2)
Total Protein: 6.6 g/dL (ref 6.1–8.1)
eGFR: 100 mL/min/{1.73_m2} (ref >=60)

## 2022-01-19 LAB — HEMOGLOBIN A1C
Hgb A1c MFr Bld: 5.8 % of total Hgb — ABNORMAL HIGH (ref <5.7)
Mean Plasma Glucose: 120 mg/dL
eAG (mmol/L): 6.6 mmol/L

## 2022-01-19 LAB — CBC WITH DIFFERENTIAL/PLATELET
Absolute Monocytes: 500 cells/uL (ref 200–950)
Basophils Absolute: 51 cells/uL (ref 0–200)
Basophils Relative: 1 %
Eosinophils Absolute: 82 cells/uL (ref 15–500)
Eosinophils Relative: 1.6 %
HCT: 43.3 % (ref 38.5–50.0)
Hemoglobin: 15 g/dL (ref 13.2–17.1)
Lymphs Abs: 1581 cells/uL (ref 850–3900)
MCH: 31.1 pg (ref 27.0–33.0)
MCHC: 34.6 g/dL (ref 32.0–36.0)
MCV: 89.6 fL (ref 80.0–100.0)
MPV: 11.5 fL (ref 7.5–12.5)
Monocytes Relative: 9.8 %
Neutro Abs: 2887 cells/uL (ref 1500–7800)
Neutrophils Relative %: 56.6 %
Platelets: 229 10*3/uL (ref 140–400)
RBC: 4.83 10*6/uL (ref 4.20–5.80)
RDW: 13.3 % (ref 11.0–15.0)
Total Lymphocyte: 31 %
WBC: 5.1 10*3/uL (ref 3.8–10.8)

## 2022-01-19 LAB — LIPID PANEL W/REFLEX DIRECT LDL
Cholesterol: 171 mg/dL (ref <200)
HDL: 55 mg/dL (ref >=40)
LDL Cholesterol (Calc): 101 mg/dL (calc) — ABNORMAL HIGH
Non-HDL Cholesterol (Calc): 116 mg/dL (calc) (ref <130)
Total CHOL/HDL Ratio: 3.1 (calc) (ref <5.0)
Triglycerides: 60 mg/dL (ref <150)

## 2022-01-19 LAB — PSA: PSA: 2.32 ng/mL (ref <=4.00)

## 2022-01-19 NOTE — Progress Notes (Signed)
Hi Nicolas Boone. Your A1C is up a little in the prediabetes range. Continue to work on [employer] and exercise. Your LDL cholesterol is just borderline but overall good. YOur metabolic panel and blood count is OK. Your prostate test was normal.

## 2022-01-21 ENCOUNTER — Encounter: Payer: Self-pay | Admitting: Family Medicine

## 2022-01-21 ENCOUNTER — Ambulatory Visit (INDEPENDENT_AMBULATORY_CARE_PROVIDER_SITE_OTHER): Payer: Medicare Other | Admitting: Family Medicine

## 2022-01-21 VITALS — BP 128/70 | HR 92 | Wt 158.0 lb

## 2022-01-21 DIAGNOSIS — R9431 Abnormal electrocardiogram [ECG] [EKG]: Secondary | ICD-10-CM

## 2022-01-21 DIAGNOSIS — Z0000 Encounter for general adult medical examination without abnormal findings: Secondary | ICD-10-CM

## 2022-01-21 NOTE — Progress Notes (Signed)
Complete physical exam  Patient: Nicolas Boone   DOB: Mar 18, 1956   66 y.o. Male  MRN: [id number]  Subjective:    Chief Complaint  Patient presents with   Annual Exam    Nicolas Boone on middle back    Nicolas Boone is a 66 y.o. male who presents today for a complete physical exam. He reports consuming a general diet.  Walks and days pickleball regularly.  He is no longer running for exercise because of his knee pain.  He generally feels well.Nicolas Boone He does have additional problems to discuss today.  He has noticed a lot more postnasal drip and drainage over the last year or 2.  He is also had some floaters in his vision this year.   Most recent fall risk assessment:    01/21/2022    2:19 PM  Fall Risk   Falls in the past year? 1  Number falls in past yr: 1  Injury with Fall? 0  Risk for fall due to : History of fall(s)  Follow up Falls evaluation completed     Most recent depression screenings:    01/21/2022    2:19 PM 01/01/2021    1:41 PM  PHQ 2/9 Scores  PHQ - 2 Score 0 0      Past Surgical History:  Procedure Laterality Date   APPENDECTOMY     REFRACTIVE SURGERY     VARICOSE VEIN SURGERY     Social History   Tobacco Use   Smoking status: Former    Types: Cigars   Smokeless tobacco: Never  Vaping Use   Vaping Use: Never used  Substance Use Topics   Alcohol use: Yes    Alcohol/week: 3.0 - 4.0 standard drinks of alcohol    Types: 3 - 4 Standard drinks or equivalent per week    Comment: weekly   Drug use: No   Allergies  Allergen Reactions   Bee Venom    Nutritional Supplements Nausea And Vomiting    Slim Fast       Patient Care Team: Agapito Games, MD as PCP - General (Family Medicine)   Outpatient Medications Prior to Visit  Medication Sig   [DISCONTINUED] diclofenac (VOLTAREN) 50 MG EC tablet Take 1 tablet (50 mg total) by mouth 2 (two) times daily. (Patient not taking: Reported on 01/21/2022)   [DISCONTINUED] triamcinolone ointment (KENALOG)  0.5 % Apply 1 application topically 2 (two) times daily as needed.   No facility-administered medications prior to visit.    ROS        Objective:     BP 128/70   Pulse 92   Wt 158 lb 0.6 oz (71.7 kg)   SpO2 98%   BMI 27.13 kg/m     Physical Exam Constitutional:      Appearance: He is well-developed.  HENT:     Head: Normocephalic and atraumatic.     Right Ear: Tympanic membrane, ear canal and external ear normal.     Left Ear: Tympanic membrane, ear canal and external ear normal.     Nose: Nose normal.     Mouth/Throat:     Pharynx: Oropharynx is clear.  Eyes:     Conjunctiva/sclera: Conjunctivae normal.     Pupils: Pupils are equal, round, and reactive to light.  Neck:     Thyroid: No thyromegaly.  Cardiovascular:     Rate and Rhythm: Normal rate and regular rhythm.     Heart sounds: Normal heart sounds.  Pulmonary:  Effort: Pulmonary effort is normal.     Breath sounds: Normal breath sounds.  Abdominal:     General: Bowel sounds are normal. There is no distension.     Palpations: Abdomen is soft. There is no mass.     Tenderness: There is no abdominal tenderness. There is no guarding or rebound.  Musculoskeletal:        General: Normal range of motion.     Cervical back: Normal range of motion and neck supple. No tenderness.  Lymphadenopathy:     Cervical: No cervical adenopathy.  Skin:    General: Skin is warm and dry.  Neurological:     Mental Status: He is alert and oriented to person, place, and time.     Deep Tendon Reflexes: Reflexes are normal and symmetric.  Psychiatric:        Behavior: Behavior normal.        Thought Content: Thought content normal.        Judgment: Judgment normal.      No results found for any visits on 01/21/22.      Assessment & Plan:    Routine Health Maintenance and Physical Exam  Immunization History  Administered Date(s) Administered   Moderna Sars-Covid-2 Vaccination 09/14/2019, 10/12/2019   Tdap  03/22/2013    Health Maintenance  Topic Date Due   COVID-19 Vaccine (3 - Moderna series) 02/06/2022 (Originally 12/07/2019)   Zoster Vaccines- Shingrix (1 of 2) 04/23/2022 (Originally 01/03/2007)   INFLUENZA VACCINE  06/20/2022 (Originally 10/20/2021)   Pneumonia Vaccine 78+ Years old (1 - PCV) 01/22/2023 (Originally 01/02/2022)   Medicare Annual Wellness (AWV)  01/22/2023   COLONOSCOPY (Pts 45-57yrs Insurance coverage will need to be confirmed)  03/09/2023   TETANUS/TDAP  03/23/2023   Hepatitis C Screening  Completed   HIV Screening  Completed   HPV VACCINES  Aged Out    Discussed health benefits of physical activity, and encouraged him to engage in regular exercise appropriate for his age and condition.  Problem List Items Addressed This Visit   None Visit Diagnoses     Wellness examination    -  Primary   Welcome to Medicare preventive visit       Relevant Orders   EKG 12-Lead   Encounter for Medicare annual wellness exam       Abnormal EKG       Relevant Orders   Ambulatory referral to Cardiology       Keep up a regular exercise program and make sure you are eating a healthy diet Try to eat 4 servings of dairy a day, or if you are lactose intolerant take a calcium with vitamin D daily.  We also discussed vaccines that are recommended based on age and which ones are covered at the pharmacy and which ones are covered here.  He will think about the pneumonia and the shingles but declined to have them done today.  No follow-ups on file.     Nani Gasser, MD    Subjective:   Nicolas Boone STATES is a 66 y.o. male who presents for a Welcome to Medicare exam.   Review of Systems: Neg ROS       Objective:    Today's Vitals   01/21/22 1417  BP: 128/70  Pulse: 92  SpO2: 98%  Weight: 158 lb 0.6 oz (71.7 kg)   Body mass index is 27.13 kg/m.  Medications Outpatient Encounter Medications as of 01/21/2022  Medication Sig   [DISCONTINUED] diclofenac (VOLTAREN)  50  MG EC tablet Take 1 tablet (50 mg total) by mouth 2 (two) times daily. (Patient not taking: Reported on 01/21/2022)   [DISCONTINUED] triamcinolone ointment (KENALOG) 0.5 % Apply 1 application topically 2 (two) times daily as needed.   No facility-administered encounter medications on file as of 01/21/2022.     History: Past Medical History:  Diagnosis Date   Osteopenia    Rotator cuff tear 12/18/2015   Past Surgical History:  Procedure Laterality Date   APPENDECTOMY     REFRACTIVE SURGERY     VARICOSE VEIN SURGERY      Family History  Problem Relation Age of Onset   Throat cancer Father    Ovarian cancer Sister    Diabetes Mellitus II Sister    Kidney cancer Brother    Diabetes Mellitus II Brother    Social History   Occupational History   Occupation: Music therapist: [employer]  Tobacco Use   Smoking status: Former    Types: Cigars   Smokeless tobacco: Never  Vaping Use   Vaping Use: Never used  Substance and Sexual Activity   Alcohol use: Yes    Alcohol/week: 3.0 - 4.0 standard drinks of alcohol    Types: 3 - 4 Standard drinks or equivalent per week    Comment: weekly   Drug use: No   Sexual activity: Yes    Partners: Female   Tobacco Counseling Counseling given: Not Answered   Immunizations and Health Maintenance Immunization History  Administered Date(s) Administered   Moderna Sars-Covid-2 Vaccination 09/14/2019, 10/12/2019   Tdap 03/22/2013   There are no preventive care reminders to display for this patient.   Activities of Daily Living    01/21/2022    3:41 PM 01/21/2022    3:05 PM  In your present state of health, do you have any difficulty performing the following activities:  Hearing? 1 1  Comment  when people are talking softly  Vision? 0 0  Difficulty concentrating or making decisions? 0 0  Walking or climbing stairs? 0 0  Dressing or bathing? 0 0  Doing errands, shopping? 0 0  Preparing Food and eating ? N   Using the  Toilet? N   In the past six months, have you accidently leaked urine? N   Do you have problems with loss of bowel control? N   Managing your Medications? N   Managing your Finances? N   Housekeeping or managing your Housekeeping? N     Physical Exam  (optional), or other factors deemed appropriate based on the beneficiary's medical and social history and current clinical standards.  Advanced Directives:      Assessment:    This is a routine wellness  examination for this patient .   Vision/Hearing screen No results found.  Dietary issues and exercise activities discussed:  Current Exercise Habits: Home exercise routine, Type of exercise: walking;Other - see comments, Frequency (Times/Week): 5, Intensity: Moderate   Goals      Exercise 150 min/wk Moderate Activity     Keep up the great work on routine exercise.         Depression Screen    01/21/2022    2:19 PM 01/01/2021    1:41 PM 12/27/2019    2:36 PM 11/02/2018    2:01 PM  PHQ 2/9 Scores  PHQ - 2 Score 0 0 0 0     Fall Risk    01/21/2022    2:19 PM  [place]  in the past year? 1  Number falls in past yr: 1  Injury with Fall? 0  Risk for fall due to : History of fall(s)  Follow up Falls evaluation completed    Cognitive Function        01/21/2022    3:06 PM  6CIT Screen  What Year? 0 points  What month? 0 points  What time? 0 points  Count back from 20 0 points  Months in reverse 0 points  Repeat phrase 0 points  Total Score 0 points    Patient Care Team: Agapito Games, MD as PCP - General (Family Medicine)     Plan:   Medicare Wellness - [place]   I have personally reviewed and noted the following in the patient's chart:   Medical and social history Use of alcohol, tobacco or illicit drugs  Current medications and supplements Functional ability and status Nutritional status Physical activity Advanced directives List of other physicians Hospitalizations, surgeries,  and ER visits in previous 12 months Vitals Screenings to include cognitive, depression, and falls Referrals and appointments EKG today shows right 77 bpm, with what looks like possibly atrial flutter, with PAC.  In addition, I have reviewed and discussed with patient certain preventive protocols, quality metrics, and best practice recommendations. A written personalized care plan for preventive services as well as general preventive health recommendations were provided to patient.    Nani Gasser, MD 01/21/2022   Orders Placed This Encounter  Procedures   Ambulatory referral to Cardiology    Referral Priority:   Routine    Referral Type:   Consultation    Referral Reason:   Specialty Services Required    Requested Specialty:   Cardiology    Number of Visits Requested:   1   EKG 12-Lead

## 2022-01-21 NOTE — Patient Instructions (Addendum)
  Please consider getting the Prevnar 20 and the shingles vaccine.

## 2023-01-24 ENCOUNTER — Telehealth: Payer: Self-pay | Admitting: Family Medicine

## 2023-01-24 ENCOUNTER — Encounter: Payer: Self-pay | Admitting: Family Medicine

## 2023-01-24 ENCOUNTER — Other Ambulatory Visit: Payer: Self-pay | Admitting: *Deleted

## 2023-01-24 ENCOUNTER — Ambulatory Visit (INDEPENDENT_AMBULATORY_CARE_PROVIDER_SITE_OTHER): Payer: Medicare Other | Admitting: Family Medicine

## 2023-01-24 VITALS — BP 115/59 | HR 74 | Ht 64.0 in | Wt 154.0 lb

## 2023-01-24 DIAGNOSIS — Z0000 Encounter for general adult medical examination without abnormal findings: Secondary | ICD-10-CM

## 2023-01-24 DIAGNOSIS — R7301 Impaired fasting glucose: Secondary | ICD-10-CM

## 2023-01-24 DIAGNOSIS — M810 Age-related osteoporosis without current pathological fracture: Secondary | ICD-10-CM | POA: Diagnosis not present

## 2023-01-24 DIAGNOSIS — R252 Cramp and spasm: Secondary | ICD-10-CM | POA: Diagnosis not present

## 2023-01-24 DIAGNOSIS — Z125 Encounter for screening for malignant neoplasm of prostate: Secondary | ICD-10-CM

## 2023-01-24 DIAGNOSIS — Z1211 Encounter for screening for malignant neoplasm of colon: Secondary | ICD-10-CM

## 2023-01-24 DIAGNOSIS — Z13220 Encounter for screening for lipoid disorders: Secondary | ICD-10-CM

## 2023-01-24 NOTE — Assessment & Plan Note (Signed)
Not currently on treatment. Will order DEXA. He is active.

## 2023-01-24 NOTE — Telephone Encounter (Signed)
Patient is requesting lab orders before appointment today

## 2023-01-24 NOTE — Progress Notes (Unsigned)
Annual Wellness Visit     Patient: Nicolas Boone, Male    DOB: 04/30/1955, 67 y.o.   MRN: [id number]  Subjective  Chief Complaint  Patient presents with   Annual Exam    Nicolas Boone is a 67 y.o. male who presents today for his Annual Wellness Visit. He reports consuming a general diet.  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 He reports sleeping fairly well. He does not have additional problems to discuss today.   He notices he is getting smaller. He says he is eating regularly.  No CP or SOB.  Last DEXA 2022.    Frequently gets muscle cramping in his thighs. Thinks may be from exercise.  Tried melatonin but upset his stomch.   HPI  {VISON DENTAL STD PSA (Optional):27386}      Medications: No outpatient medications prior to visit.   No facility-administered medications prior to visit.    Allergies  Allergen Reactions   Bee Venom    Nutritional Supplements Nausea And Vomiting    Slim Fast     Patient Care Team: Agapito Games, MD as PCP - General (Family Medicine)  ROS      Objective  BP (!) 115/59   Pulse 74   Ht 5\' 4"  (1.626 m)   Wt 154 lb (69.9 kg)   SpO2 99%   BMI 26.43 kg/m     Physical Exam Constitutional:      Appearance: Normal appearance.  HENT:     Head: Normocephalic and atraumatic.     Right Ear: Tympanic membrane, ear canal and external ear normal.     Left Ear: Tympanic membrane, ear canal and external ear normal.     Nose: Nose normal.     Mouth/Throat:     Pharynx: Oropharynx is clear.  Eyes:     Extraocular Movements: Extraocular movements intact.     Conjunctiva/sclera: Conjunctivae normal.     Pupils: Pupils are equal, round, and reactive to light.  Neck:     Thyroid: No thyromegaly.  Cardiovascular:     Rate and Rhythm: Normal rate and regular rhythm.  Pulmonary:     Effort: Pulmonary effort is normal.     Breath sounds: Normal breath sounds.  Abdominal:     General: Bowel  sounds are normal.     Palpations: Abdomen is soft.     Tenderness: There is no abdominal tenderness.  Musculoskeletal:        General: No swelling.     Cervical back: Neck supple.  Skin:    General: Skin is warm and dry.  Neurological:     Mental Status: He is oriented to person, place, and time.  Psychiatric:        Mood and Affect: Mood normal.        Behavior: Behavior normal.       Most recent functional status assessment:    01/24/2023    2:21 PM  In your present state of health, do you have any difficulty performing the following activities:  Hearing? 0  Vision? 0  Difficulty concentrating or making decisions? 0  Walking or climbing stairs? 0  Dressing or bathing? 0  Doing errands, shopping? 0  Preparing Food and eating ? N  Do you have problems with loss of bowel control? N  Managing your Medications? N  Managing your Finances? N  Housekeeping or managing your Housekeeping? N   Most recent fall risk assessment:  01/24/2023    1:52 PM  Fall Risk   Falls in the past year? 0  Number falls in past yr: 0  Injury with Fall? 0  Risk for fall due to : No Fall Risks  Follow up Falls evaluation completed    Most recent depression screenings:    01/24/2023    1:52 PM 01/21/2022    2:19 PM  PHQ 2/9 Scores  PHQ - 2 Score 0 0   Most recent cognitive screening:    01/21/2022    3:06 PM  6CIT Screen  What Year? 0 points  What month? 0 points  What time? 0 points  Count back from 20 0 points  Months in reverse 0 points  Repeat phrase 0 points  Total Score 0 points   Most recent Audit-C alcohol use screening     No data to display         A score of 3 or more in women, and 4 or more in men indicates increased risk for alcohol abuse, EXCEPT if all of the points are from question 1   Vision/Hearing Screen: No results found.     No results found for any visits on 01/24/23.    Assessment & Plan   Annual wellness visit done today including the all  of the following: Reviewed patient's Family Medical History Reviewed and updated list of patient's medical providers Assessment of cognitive impairment was done Assessed patient's functional ability Established a written schedule for health screening services Health Risk Assessent Completed and Reviewed  Exercise Activities and Dietary recommendations  Goals      Exercise 150 min/wk Moderate Activity     Keep up the great work on routine exercise.         Immunization History  Administered Date(s) Administered   Moderna Sars-Covid-2 Vaccination 09/14/2019, 10/12/2019   Tdap 03/22/2013    Health Maintenance  Topic Date Due   Colonoscopy  03/09/2023   INFLUENZA VACCINE  06/20/2023 (Originally 10/21/2022)   Pneumonia Vaccine 56+ Years old (1 of 1 - PCV) 01/24/2024 (Originally 01/02/2022)   COVID-19 Vaccine (3 - 2023-24 season) 02/09/2024 (Originally 11/21/2022)   Zoster Vaccines- Shingrix (1 of 2) 02/23/2024 (Originally 01/03/2007)   DTaP/Tdap/Td (2 - Td or Tdap) 03/23/2023   Medicare Annual Wellness (AWV)  01/24/2024   Hepatitis C Screening  Completed   HPV VACCINES  Aged Out     Discussed health benefits of physical activity, and encouraged him to engage in regular exercise appropriate for his age and condition.    Problem List Items Addressed This Visit       Musculoskeletal and Integument   Osteoporosis    Not currently on treatment. Will order DEXA. He is active.       Relevant Orders   DG Bone Density   Other Visit Diagnoses     Screen for colon cancer    -  Primary   Relevant Orders   Ambulatory referral to Gastroenterology   Medicare annual wellness visit, subsequent       Muscle cramping           Keep up a regular exercise program and make sure you are eating a healthy diet Try to eat 4 servings of dairy a day, or if you are lactose intolerant take a calcium with vitamin D daily.  Your vaccines are up to date.  SDOH updated today.   Return in  about 1 year (around 01/24/2024) for Medicare Wellness .  Nani Gasser, MD

## 2023-01-24 NOTE — Telephone Encounter (Signed)
Labs ordered and printed

## 2023-01-24 NOTE — Patient Instructions (Signed)
  Mr. Nicolas Boone , Thank you for taking time to come for your Medicare Wellness Visit. I appreciate your ongoing commitment to your health goals. Please review the following plan we discussed and let me know if I can assist you in the future.   These are the goals we discussed:  Goals      Exercise 150 min/wk Moderate Activity     Keep up the great work on routine exercise.         This is a list of the screening recommended for you and due dates:  Health Maintenance  Topic Date Due   Colon Cancer Screening  03/09/2023   Flu Shot  06/20/2023*   Pneumonia Vaccine (1 of 1 - PCV) 01/24/2024*   COVID-19 Vaccine (3 - 2023-24 season) 02/09/2024*   Zoster (Shingles) Vaccine (1 of 2) 02/23/2024*   DTaP/Tdap/Td vaccine (2 - Td or Tdap) 03/23/2023   Medicare Annual Wellness Visit  01/24/2024   Hepatitis C Screening  Completed   HPV Vaccine  Aged Out  *Topic was postponed. The date shown is not the original due date.

## 2023-01-25 LAB — CBC WITH DIFFERENTIAL/PLATELET
Basophils Absolute: 0.1 10*3/uL (ref 0.0–0.2)
Basos: 1 %
EOS (ABSOLUTE): 0.2 10*3/uL (ref 0.0–0.4)
Eos: 3 %
Hematocrit: 48.3 % (ref 37.5–51.0)
Hemoglobin: 15.7 g/dL (ref 13.0–17.7)
Immature Grans (Abs): 0 10*3/uL (ref 0.0–0.1)
Immature Granulocytes: 0 %
Lymphocytes Absolute: 1.6 10*3/uL (ref 0.7–3.1)
Lymphs: 25 %
MCH: 30.5 pg (ref 26.6–33.0)
MCHC: 32.5 g/dL (ref 31.5–35.7)
MCV: 94 fL (ref 79–97)
Monocytes Absolute: 0.6 10*3/uL (ref 0.1–0.9)
Monocytes: 9 %
Neutrophils Absolute: 4 10*3/uL (ref 1.4–7.0)
Neutrophils: 62 %
Platelets: 204 10*3/uL (ref 150–450)
RBC: 5.14 x10E6/uL (ref 4.14–5.80)
RDW: 12.6 % (ref 11.6–15.4)
WBC: 6.4 10*3/uL (ref 3.4–10.8)

## 2023-01-25 LAB — LIPID PANEL WITH LDL/HDL RATIO
Cholesterol, Total: 192 mg/dL (ref 100–199)
HDL: 61 mg/dL (ref >39)
LDL Chol Calc (NIH): 118 mg/dL — ABNORMAL HIGH (ref 0–99)
LDL/HDL Ratio: 1.9 ratio (ref 0.0–3.6)
Triglycerides: 69 mg/dL (ref 0–149)
VLDL Cholesterol Cal: 13 mg/dL (ref 5–40)

## 2023-01-25 LAB — CMP14+EGFR
ALT: 15 [IU]/L (ref 0–44)
AST: 24 [IU]/L (ref 0–40)
Albumin: 4.4 g/dL (ref 3.9–4.9)
Alkaline Phosphatase: 86 [IU]/L (ref 44–121)
BUN/Creatinine Ratio: 16 (ref 10–24)
BUN: 12 mg/dL (ref 8–27)
Bilirubin Total: 0.5 mg/dL (ref 0.0–1.2)
CO2: 22 mmol/L (ref 20–29)
Calcium: 9.7 mg/dL (ref 8.6–10.2)
Chloride: 101 mmol/L (ref 96–106)
Creatinine, Ser: 0.75 mg/dL — ABNORMAL LOW (ref 0.76–1.27)
Globulin, Total: 2.4 g/dL (ref 1.5–4.5)
Glucose: 97 mg/dL (ref 70–99)
Potassium: 4.6 mmol/L (ref 3.5–5.2)
Sodium: 138 mmol/L (ref 134–144)
Total Protein: 6.8 g/dL (ref 6.0–8.5)
eGFR: 100 mL/min/{1.73_m2} (ref >59)

## 2023-01-25 LAB — HEMOGLOBIN A1C
Est. average glucose Bld gHb Est-mCnc: 123 mg/dL
Hgb A1c MFr Bld: 5.9 % — ABNORMAL HIGH (ref 4.8–5.6)

## 2023-01-25 LAB — PSA: Prostate Specific Ag, Serum: 4 ng/mL (ref 0.0–4.0)

## 2023-01-25 NOTE — Progress Notes (Signed)
HI Nicolas Boone, metabolic panel overall looks good.  LDL slightly elevated at 118.  Goal is less than 100.  A1c is still in the prediabetes range at 5.9 similar to last year so it is very stable.  Prostate test is normal.  Blood count is normal.  No anemia.

## 2023-10-04 LAB — HM COLONOSCOPY

## 2023-10-06 ENCOUNTER — Encounter: Payer: Self-pay | Admitting: Family Medicine
